# Patient Record
Sex: Female | Born: 1990 | Race: White | Hispanic: No | Marital: Married | Smoking: Never smoker
Health system: Southern US, Community
[De-identification: ages and names within clinical notes are randomized; demographics above are authoritative.]

## PROBLEM LIST (undated history)

## (undated) DIAGNOSIS — R112 Nausea with vomiting, unspecified: Secondary | ICD-10-CM

## (undated) DIAGNOSIS — Z9889 Other specified postprocedural states: Secondary | ICD-10-CM

## (undated) DIAGNOSIS — N200 Calculus of kidney: Secondary | ICD-10-CM

## (undated) DIAGNOSIS — G43909 Migraine, unspecified, not intractable, without status migrainosus: Secondary | ICD-10-CM

## (undated) DIAGNOSIS — K859 Acute pancreatitis without necrosis or infection, unspecified: Secondary | ICD-10-CM

## (undated) DIAGNOSIS — T7840XA Allergy, unspecified, initial encounter: Secondary | ICD-10-CM

## (undated) DIAGNOSIS — B019 Varicella without complication: Secondary | ICD-10-CM

## (undated) HISTORY — PX: ERCP: SHX60

## (undated) HISTORY — DX: Allergy, unspecified, initial encounter: T78.40XA

## (undated) HISTORY — PX: CHOLECYSTECTOMY: SHX55

## (undated) HISTORY — DX: Varicella without complication: B01.9

---

## 2014-04-06 ENCOUNTER — Emergency Department (HOSPITAL_BASED_OUTPATIENT_CLINIC_OR_DEPARTMENT_OTHER): Payer: BC Managed Care – PPO

## 2014-04-06 ENCOUNTER — Emergency Department (HOSPITAL_BASED_OUTPATIENT_CLINIC_OR_DEPARTMENT_OTHER)
Admission: EM | Admit: 2014-04-06 | Discharge: 2014-04-06 | Disposition: A | Discharge status: Home / Self Care | Payer: BC Managed Care – PPO | Attending: Emergency Medicine | Admitting: Emergency Medicine

## 2014-04-06 ENCOUNTER — Encounter (HOSPITAL_BASED_OUTPATIENT_CLINIC_OR_DEPARTMENT_OTHER): Payer: Self-pay | Admitting: General Practice

## 2014-04-06 DIAGNOSIS — Z3202 Encounter for pregnancy test, result negative: Secondary | ICD-10-CM | POA: Insufficient documentation

## 2014-04-06 DIAGNOSIS — Z8719 Personal history of other diseases of the digestive system: Secondary | ICD-10-CM | POA: Diagnosis not present

## 2014-04-06 DIAGNOSIS — R1031 Right lower quadrant pain: Secondary | ICD-10-CM | POA: Insufficient documentation

## 2014-04-06 DIAGNOSIS — N200 Calculus of kidney: Secondary | ICD-10-CM | POA: Insufficient documentation

## 2014-04-06 DIAGNOSIS — Z9089 Acquired absence of other organs: Secondary | ICD-10-CM | POA: Insufficient documentation

## 2014-04-06 HISTORY — DX: Acute pancreatitis without necrosis or infection, unspecified: K85.90

## 2014-04-06 LAB — URINALYSIS, ROUTINE W REFLEX MICROSCOPIC
Bilirubin Urine: NEGATIVE
Glucose, UA: NEGATIVE mg/dL
Ketones, ur: NEGATIVE mg/dL
LEUKOCYTES UA: NEGATIVE
Nitrite: NEGATIVE
PH: 6 (ref 5.0–8.0)
Protein, ur: NEGATIVE mg/dL
Specific Gravity, Urine: 1.028 (ref 1.005–1.030)
Urobilinogen, UA: 0.2 mg/dL (ref 0.0–1.0)

## 2014-04-06 LAB — COMPREHENSIVE METABOLIC PANEL
ALBUMIN: 3.9 g/dL (ref 3.5–5.2)
ALK PHOS: 105 U/L (ref 39–117)
ALT: 15 U/L (ref 0–35)
AST: 17 U/L (ref 0–37)
Anion gap: 13 (ref 5–15)
BILIRUBIN TOTAL: 0.6 mg/dL (ref 0.3–1.2)
BUN: 12 mg/dL (ref 6–23)
CHLORIDE: 105 meq/L (ref 96–112)
CO2: 24 meq/L (ref 19–32)
Calcium: 9.8 mg/dL (ref 8.4–10.5)
Creatinine, Ser: 0.9 mg/dL (ref 0.50–1.10)
GFR calc Af Amer: >90 mL/min (ref >90)
GFR calc non Af Amer: 89 mL/min — ABNORMAL LOW (ref >90)
Glucose, Bld: 110 mg/dL — ABNORMAL HIGH (ref 70–99)
Potassium: 3.9 mEq/L (ref 3.7–5.3)
Sodium: 142 mEq/L (ref 137–147)
Total Protein: 7.4 g/dL (ref 6.0–8.3)

## 2014-04-06 LAB — CBC WITH DIFFERENTIAL/PLATELET
BASOS ABS: 0 10*3/uL (ref 0.0–0.1)
BASOS PCT: 0 % (ref 0–1)
EOS PCT: 3 % (ref 0–5)
Eosinophils Absolute: 0.2 10*3/uL (ref 0.0–0.7)
HCT: 38.9 % (ref 36.0–46.0)
Hemoglobin: 12.8 g/dL (ref 12.0–15.0)
Lymphocytes Relative: 32 % (ref 12–46)
Lymphs Abs: 2.3 10*3/uL (ref 0.7–4.0)
MCH: 28.8 pg (ref 26.0–34.0)
MCHC: 32.9 g/dL (ref 30.0–36.0)
MCV: 87.4 fL (ref 78.0–100.0)
MONO ABS: 0.4 10*3/uL (ref 0.1–1.0)
Monocytes Relative: 5 % (ref 3–12)
Neutro Abs: 4.2 10*3/uL (ref 1.7–7.7)
Neutrophils Relative %: 60 % (ref 43–77)
Platelets: 320 10*3/uL (ref 150–400)
RBC: 4.45 MIL/uL (ref 3.87–5.11)
RDW: 12.6 % (ref 11.5–15.5)
WBC: 7.1 10*3/uL (ref 4.0–10.5)

## 2014-04-06 LAB — URINE MICROSCOPIC-ADD ON

## 2014-04-06 LAB — PREGNANCY, URINE: PREG TEST UR: NEGATIVE

## 2014-04-06 MED ORDER — ONDANSETRON HCL 4 MG/2ML IJ SOLN
4.0000 mg | Freq: Once | INTRAMUSCULAR | Status: AC
  Administered 2014-04-06: 4 mg via INTRAVENOUS
  Filled 2014-04-06: qty 2

## 2014-04-06 MED ORDER — HYDROMORPHONE HCL 1 MG/ML IJ SOLN
1.0000 mg | Freq: Once | INTRAMUSCULAR | Status: AC
  Administered 2014-04-06: 1 mg via INTRAVENOUS
  Filled 2014-04-06: qty 1

## 2014-04-06 MED ORDER — KETOROLAC TROMETHAMINE 30 MG/ML IJ SOLN
15.0000 mg | Freq: Once | INTRAMUSCULAR | Status: AC
  Administered 2014-04-06: 15 mg via INTRAVENOUS

## 2014-04-06 MED ORDER — OXYCODONE HCL 5 MG PO CAPS: 5.0000 mg | ORAL_CAPSULE | ORAL | Status: DC | PRN

## 2014-04-06 MED ORDER — KETOROLAC TROMETHAMINE 30 MG/ML IJ SOLN
INTRAMUSCULAR | Status: AC
  Filled 2014-04-06: qty 1

## 2014-04-06 MED ORDER — TAMSULOSIN HCL 0.4 MG PO CAPS
0.4000 mg | ORAL_CAPSULE | Freq: Once | ORAL | Status: AC
  Administered 2014-04-06: 0.4 mg via ORAL
  Filled 2014-04-06: qty 1

## 2014-04-06 MED ORDER — ONDANSETRON HCL 4 MG PO TABS: 4.0000 mg | ORAL_TABLET | Freq: Three times a day (TID) | ORAL | Status: DC | PRN

## 2014-04-06 MED ORDER — TAMSULOSIN HCL 0.4 MG PO CAPS: 0.4000 mg | ORAL_CAPSULE | Freq: Every day | ORAL | Status: DC

## 2014-04-06 NOTE — ED Notes (Signed)
Pt has abdominal pain x 2 days. Pt states she has been urinating frequently. No vomiting. Feeling nauseated now. Pt rocking back and forth due to pain.Pt c/o of pain at right lower abdominal and states it goes around to her back. Pt took aleve around 6 am.

## 2014-04-06 NOTE — Discharge Instructions (Signed)
Your CT of the abdomen shows a small (2 mm) kidney stone on the right side. It will certainly pass on its own. Keep drinking fluids. We will provide you with some medicine to help the stone pass (flomax), and to help with pain (oxycodone) and nausea (zofran).  - You should take ibuprofen 600mg  every 6 hours for the pain and take the oxyIR only as needed.  Kidney Stones Kidney stones (urolithiasis) are deposits that form inside your kidneys. The intense pain is caused by the stone moving through the urinary tract. When the stone moves, the ureter goes into spasm around the stone. The stone is usually passed in the urine.  CAUSES   A disorder that makes certain neck glands produce too much parathyroid hormone (primary hyperparathyroidism).  A buildup of uric acid crystals, similar to gout in your joints.  Narrowing (stricture) of the ureter.  A kidney obstruction present at birth (congenital obstruction).  Previous surgery on the kidney or ureters.  Numerous kidney infections. SYMPTOMS   Feeling sick to your stomach (nauseous).  Throwing up (vomiting).  Blood in the urine (hematuria).  Pain that usually spreads (radiates) to the groin.  Frequency or urgency of urination. DIAGNOSIS   Taking a history and physical exam.  Blood or urine tests.  CT scan.  Occasionally, an examination of the inside of the urinary bladder (cystoscopy) is performed. TREATMENT   Observation.  Increasing your fluid intake.  Extracorporeal shock wave lithotripsy--This is a noninvasive procedure that uses shock waves to break up kidney stones.  Surgery may be needed if you have severe pain or persistent obstruction. There are various surgical procedures. Most of the procedures are performed with the use of small instruments. Only small incisions are needed to accommodate these instruments, so recovery time is minimized. The size, location, and chemical composition are all important variables that  will determine the proper choice of action for you. Talk to your health care provider to better understand your situation so that you will minimize the risk of injury to yourself and your kidney.  HOME CARE INSTRUCTIONS   Drink enough water and fluids to keep your urine clear or pale yellow. This will help you to pass the stone or stone fragments.  Strain all urine through the provided strainer. Keep all particulate matter and stones for your health care provider to see. The stone causing the pain may be as small as a grain of salt. It is very important to use the strainer each and every time you pass your urine. The collection of your stone will allow your health care provider to analyze it and verify that a stone has actually passed. The stone analysis will often identify what you can do to reduce the incidence of recurrences.  Only take over-the-counter or prescription medicines for pain, discomfort, or fever as directed by your health care provider.  Make a follow-up appointment with your health care provider as directed.  Get follow-up X-rays if required. The absence of pain does not always mean that the stone has passed. It may have only stopped moving. If the urine remains completely obstructed, it can cause loss of kidney function or even complete destruction of the kidney. It is your responsibility to make sure X-rays and follow-ups are completed. Ultrasounds of the kidney can show blockages and the status of the kidney. Ultrasounds are not associated with any radiation and can be performed easily in a matter of minutes. SEEK MEDICAL CARE IF:  You experience pain that  is progressive and unresponsive to any pain medicine you have been prescribed. SEEK IMMEDIATE MEDICAL CARE IF:   Pain cannot be controlled with the prescribed medicine.  You have a fever or shaking chills.  The severity or intensity of pain increases over 18 hours and is not relieved by pain medicine.  You develop a new  onset of abdominal pain.  You feel faint or pass out.  You are unable to urinate. MAKE SURE YOU:   Understand these instructions.  Will watch your condition.  Will get help right away if you are not doing well or get worse. Document Released: 04/27/2005 Document Revised: 12/28/2012 Document Reviewed: 09/28/2012 [facility] Patient Information 2015 [employer] [place], [place]. This information is not intended to replace advice given to you by your health care provider. Make sure you discuss any questions you have with your health care provider.

## 2014-04-06 NOTE — ED Provider Notes (Signed)
CSN: [id number]     Arrival date & time 04/06/14  0702 History   First MD Initiated Contact with Patient 04/06/14 [phone]     Chief Complaint  Patient presents with  . Abdominal Pain   23 yo female presenting with abdominal pain.   Pain started 2 days ago, was intermittent and mild-moderate and has become constant and severe located mostly in the RLQ radiating to the right flank. The location has not changed. She has tried aleve with minimal relief of pain. She has had nausea without emesis. Appetite has been diminished but not gone. Ate cereal this morning. She has had many stools after drinking a large quantity of cranberry juice because she thought she may have a UTI. She denies fevers, chills, dysuria, urinary frequency or urgency, hematuria, diarrhea or constipation or blood in her stool. She is sexually active with 1 female partner, uses OCP daily without missing doses, no history of GC/Chl in her or him. She is currently menstruating, started 11/25. No vaginal discharge. She has no history of diabetes but checks her blood sugar intermittently, reports it has been < 200.   She has a history of gallstone pancreatitis s/p cholecystectomy.   (Consider location/radiation/quality/duration/timing/severity/associated sxs/prior Treatment) HPI  Past Medical History  Diagnosis Date  . Pancreatitis    Past Surgical History  Procedure Laterality Date  . Cholecystectomy     History reviewed. No pertinent family history. History  Substance Use Topics  . Smoking status: Not on file  . Smokeless tobacco: Not on file  . Alcohol Use: Not on file   OB History    No data available     Review of Systems  Constitutional: Positive for appetite change. Negative for fever, chills and diaphoresis.  HENT: Negative for rhinorrhea and sneezing.   Eyes: Negative for photophobia.  Respiratory: Negative for cough, chest tightness, shortness of breath and wheezing.   Cardiovascular: Negative for chest pain.   Gastrointestinal: Positive for nausea and abdominal pain. Negative for vomiting, diarrhea, constipation, blood in stool and abdominal distention.  Endocrine: Negative for polydipsia, polyphagia and polyuria.  Genitourinary: Negative for dysuria, urgency, frequency, hematuria, flank pain, decreased urine volume and difficulty urinating.  Musculoskeletal: Positive for back pain (R flank). Negative for myalgias, neck pain and neck stiffness.  Skin: Negative for rash.  Allergic/Immunologic: Negative for food allergies.  Neurological: Negative for syncope.      Allergies  Review of patient's allergies indicates no known allergies.  Home Medications   Prior to Admission medications   Not on File   BP 132/99 mmHg  Pulse 93  Temp(Src) 98.3 F (36.8 C) (Oral)  Resp 20  Ht 5\' 7"  (1.702 m)  Wt 177 lb (80.287 kg)  BMI 27.72 kg/m2  SpO2 100%  LMP 04/04/2014 (Exact Date) Physical Exam  Constitutional: She is oriented to person, place, and time. She appears well-developed and well-nourished.  23 yo F retching without emesis as I walk into room. Sitting on side of the bed rocking back and forth.  HENT:  Mouth/Throat: Oropharynx is clear and moist.  Neck: Normal range of motion.  Cardiovascular: Normal rate, regular rhythm and normal heart sounds.   cap refill < 3 sec  Pulmonary/Chest: Effort normal and breath sounds normal.  Abdominal: Soft. She exhibits no distension and no mass. There is tenderness (to palpation of RLQ and deep palpation of right lower flank (well below CVA)). There is no rebound and no guarding.  Musculoskeletal: She exhibits no edema.  Neurological: She  is alert and oriented to person, place, and time. She exhibits normal muscle tone.  Skin: Skin is warm and dry. No rash noted.  Vitals reviewed.   ED Course  Procedures (including critical care time) Labs Review Labs Reviewed  URINALYSIS, ROUTINE W REFLEX MICROSCOPIC - Abnormal; Notable for the following:     Hgb urine dipstick SMALL (*)    All other components within normal limits  COMPREHENSIVE METABOLIC PANEL - Abnormal; Notable for the following:    Glucose, Bld 110 (*)    GFR calc non Af Amer 89 (*)    All other components within normal limits  URINE MICROSCOPIC-ADD ON - Abnormal; Notable for the following:    Squamous Epithelial / LPF FEW (*)    Bacteria, UA FEW (*)    All other components within normal limits  PREGNANCY, URINE  CBC WITH DIFFERENTIAL    Imaging Review Ct Abdomen Pelvis Wo Contrast  04/06/2014   CLINICAL DATA:  RIGHT lower quadrant pain, nausea, vomiting, personal history of pancreatitis and cholecystectomy  EXAM: CT ABDOMEN AND PELVIS WITHOUT CONTRAST  TECHNIQUE: Multidetector CT imaging of the abdomen and pelvis was performed following the standard protocol without IV contrast. Sagittal and coronal MPR images reconstructed from axial data set.  COMPARISON:  None  FINDINGS: Lung bases clear.  RIGHT hydronephrosis and hydroureter secondary to a 2 mm calculus at the RIGHT ureterovesical junction.  No additional urinary tract calcifications.  Upper normal hepatic attenuation.  Within limits of technique, liver, spleen, pancreas, kidneys, and adrenal glands otherwise normal.  Stomach and bowel loops grossly unremarkable for exam lacking IV and oral contrast.  Normal appendix, uterus, adnexae, and LEFT ureter.  No mass, adenopathy, free air or hernia.  Tiny amount of nonspecific low-attenuation fluid in pelvis, potentially physiologic.  Tampon in vagina.  Osseous structures unremarkable.  IMPRESSION: LEFT hydronephrosis and hydroureter secondary to a 2 mm RIGHT UVJ calculus.   Electronically Signed   By: Ulyses SouthwardMark  Boles M.D.   On: 04/06/2014 08:10     EKG Interpretation None      MDM   Final diagnoses:  Right lower quadrant abdominal pain  Renal calculus, right   23 yo with abdominal pain focused in RLQ/right flank. Need CT to r/o appendicitis and evaluate for  nephrolithiasis.  CT Abd shows 2mm calculus at right UV junction with RIGHT hydronephrosis and hydroureter. Will provide analgesia, antiemetic and alpha blocker given it is a distal subcentimeter stone. Will almost certainly pass spontaneously. She is stable for discharge.   Helio Lack B. Jarvis NewcomerGrunz, MD, PGY-2 04/06/2014 8:37 AM   Tyrone Nineyan B Elodie Panameno, MD 04/06/14 [id number]  Nelia Shiobert L Beaton, MD 04/06/14 1524

## 2014-08-23 LAB — OB RESULTS CONSOLE HIV ANTIBODY (ROUTINE TESTING): HIV: NONREACTIVE

## 2014-08-23 LAB — OB RESULTS CONSOLE HEPATITIS B SURFACE ANTIGEN: Hepatitis B Surface Ag: NEGATIVE

## 2014-08-23 LAB — OB RESULTS CONSOLE RUBELLA ANTIBODY, IGM: Rubella: IMMUNE

## 2014-08-23 LAB — OB RESULTS CONSOLE RPR: RPR: NONREACTIVE

## 2014-09-04 LAB — OB RESULTS CONSOLE GC/CHLAMYDIA
Chlamydia: NEGATIVE
GC PROBE AMP, GENITAL: NEGATIVE

## 2014-12-10 ENCOUNTER — Inpatient Hospital Stay (HOSPITAL_COMMUNITY): Admission: AD | Admit: 2014-12-10 | Payer: Self-pay | Source: Ambulatory Visit | Admitting: Obstetrics and Gynecology

## 2015-03-06 LAB — OB RESULTS CONSOLE GBS: GBS: NEGATIVE

## 2015-04-06 ENCOUNTER — Inpatient Hospital Stay (HOSPITAL_COMMUNITY)
Admission: AD | Admit: 2015-04-06 | Discharge: 2015-04-06 | Disposition: A | Discharge status: Home / Self Care | Payer: BLUE CROSS/BLUE SHIELD | Source: Ambulatory Visit | Attending: Obstetrics and Gynecology | Admitting: Obstetrics and Gynecology

## 2015-04-06 ENCOUNTER — Encounter (HOSPITAL_COMMUNITY): Payer: Self-pay

## 2015-04-06 HISTORY — DX: Calculus of kidney: N20.0

## 2015-04-06 HISTORY — DX: Nausea with vomiting, unspecified: R11.2

## 2015-04-06 HISTORY — DX: Other specified postprocedural states: Z98.890

## 2015-04-06 NOTE — Discharge Instructions (Signed)
Braxton Hicks Contractions °Contractions of the uterus can occur throughout pregnancy. Contractions are not always a sign that you are in labor.  °WHAT ARE BRAXTON HICKS CONTRACTIONS?  °Contractions that occur before labor are called Braxton Hicks contractions, or false labor. Toward the end of pregnancy (32-34 weeks), these contractions can develop more often and may become more forceful. This is not true labor because these contractions do not result in opening (dilatation) and thinning of the cervix. They are sometimes difficult to tell apart from true labor because these contractions can be forceful and people have different pain tolerances. You should not feel embarrassed if you go to the hospital with false labor. Sometimes, the only way to tell if you are in true labor is for your health care provider to look for changes in the cervix. °If there are no prenatal problems or other health problems associated with the pregnancy, it is completely safe to be sent home with false labor and await the onset of true labor. °HOW CAN YOU TELL THE DIFFERENCE BETWEEN TRUE AND FALSE LABOR? °False Labor °· The contractions of false labor are usually shorter and not as hard as those of true labor.   °· The contractions are usually irregular.   °· The contractions are often felt in the front of the lower abdomen and in the groin.   °· The contractions may go away when you walk around or change positions while lying down.   °· The contractions get weaker and are shorter lasting as time goes on.   °· The contractions do not usually become progressively stronger, regular, and closer together as with true labor.   °True Labor °1. Contractions in true labor last 30-70 seconds, become very regular, usually become more intense, and increase in frequency.   °2. The contractions do not go away with walking.   °3. The discomfort is usually felt in the top of the uterus and spreads to the lower abdomen and low back.   °4. True labor can  be determined by your health care provider with an exam. This will show that the cervix is dilating and getting thinner.   °WHAT TO REMEMBER °· Keep up with your usual exercises and follow other instructions given by your health care provider.   °· Take medicines as directed by your health care provider.   °· Keep your regular prenatal appointments.   °· Eat and drink lightly if you think you are going into labor.   °· If Braxton Hicks contractions are making you uncomfortable:   °· Change your position from lying down or resting to walking, or from walking to resting.   °· Sit and rest in a tub of warm water.   °· Drink 2-3 glasses of water. Dehydration may cause these contractions.   °· Do slow and deep breathing several times an hour.   °WHEN SHOULD I SEEK IMMEDIATE MEDICAL CARE? °Seek immediate medical care if: °· Your contractions become stronger, more regular, and closer together.   °· You have fluid leaking or gushing from your vagina.   °· You have a fever.   °· You pass blood-tinged mucus.   °· You have vaginal bleeding.   °· You have continuous abdominal pain.   °· You have low back pain that you never had before.   °· You feel your baby's head pushing down and causing pelvic pressure.   °· Your baby is not moving as much as it used to.   °  °This information is not intended to replace advice given to you by your health care provider. Make sure you discuss any questions you have with your health care   provider. °  °Document Released: 04/27/2005 Document Revised: 05/02/2013 Document Reviewed: 02/06/2013 °Elsevier Interactive Patient Education ©2016 [employer] ° °Fetal Movement Counts °Patient Name: __________________________________________________ Patient Due Date: ____________________ °Performing a fetal movement count is highly recommended in high-risk pregnancies, but it is good for every pregnant woman to do. Your health care provider may ask you to start counting fetal movements at 28 weeks of the  pregnancy. Fetal movements often increase: °· After eating a full meal. °· After physical activity. °· After eating or drinking something sweet or cold. °· At rest. °Pay attention to when you feel the baby is most active. This will help you notice a pattern of your baby's sleep and wake cycles and what factors contribute to an increase in fetal movement. It is important to perform a fetal movement count at the same time each day when your baby is normally most active.  °HOW TO COUNT FETAL MOVEMENTS °5. Find a quiet and comfortable area to sit or lie down on your left side. Lying on your left side provides the best blood and oxygen circulation to your baby. °6. Write down the day and time on a sheet of paper or in a journal. °7. Start counting kicks, flutters, swishes, rolls, or jabs in a 2-hour period. You should feel at least 10 movements within 2 hours. °8. If you do not feel 10 movements in 2 hours, wait 2-3 hours and count again. Look for a change in the pattern or not enough counts in 2 hours. °SEEK MEDICAL CARE IF: °· You feel less than 10 counts in 2 hours, tried twice. °· There is no movement in over an hour. °· The pattern is changing or taking longer each day to reach 10 counts in 2 hours. °· You feel the baby is not moving as he or she usually does. °Date: ____________ Movements: ____________ Start time: ____________ Finish time: ____________  °Date: ____________ Movements: ____________ Start time: ____________ Finish time: ____________ °Date: ____________ Movements: ____________ Start time: ____________ Finish time: ____________ °Date: ____________ Movements: ____________ Start time: ____________ Finish time: ____________ °Date: ____________ Movements: ____________ Start time: ____________ Finish time: ____________ °Date: ____________ Movements: ____________ Start time: ____________ Finish time: ____________ °Date: ____________ Movements: ____________ Start time: ____________ Finish time:  ____________ °Date: ____________ Movements: ____________ Start time: ____________ Finish time: ____________  °Date: ____________ Movements: ____________ Start time: ____________ Finish time: ____________ °Date: ____________ Movements: ____________ Start time: ____________ Finish time: ____________ °Date: ____________ Movements: ____________ Start time: ____________ Finish time: ____________ °Date: ____________ Movements: ____________ Start time: ____________ Finish time: ____________ °Date: ____________ Movements: ____________ Start time: ____________ Finish time: ____________ °Date: ____________ Movements: ____________ Start time: ____________ Finish time: ____________ °Date: ____________ Movements: ____________ Start time: ____________ Finish time: ____________  °Date: ____________ Movements: ____________ Start time: ____________ Finish time: ____________ °Date: ____________ Movements: ____________ Start time: ____________ Finish time: ____________ °Date: ____________ Movements: ____________ Start time: ____________ Finish time: ____________ °Date: ____________ Movements: ____________ Start time: ____________ Finish time: ____________ °Date: ____________ Movements: ____________ Start time: ____________ Finish time: ____________ °Date: ____________ Movements: ____________ Start time: ____________ Finish time: ____________ °Date: ____________ Movements: ____________ Start time: ____________ Finish time: ____________  °Date: ____________ Movements: ____________ Start time: ____________ Finish time: ____________ °Date: ____________ Movements: ____________ Start time: ____________ Finish time: ____________ °Date: ____________ Movements: ____________ Start time: ____________ Finish time: ____________ °Date: ____________ Movements: ____________ Start time: ____________ Finish time: ____________ °Date: ____________ Movements: ____________ Start time: ____________ Finish time: ____________ °Date: ____________ Movements:  ____________ Start time: ____________ Finish   time: ____________ °Date: ____________ Movements: ____________ Start time: ____________ Finish time: ____________  °Date: ____________ Movements: ____________ Start time: ____________ Finish time: ____________ °Date: ____________ Movements: ____________ Start time: ____________ Finish time: ____________ °Date: ____________ Movements: ____________ Start time: ____________ Finish time: ____________ °Date: ____________ Movements: ____________ Start time: ____________ Finish time: ____________ °Date: ____________ Movements: ____________ Start time: ____________ Finish time: ____________ °Date: ____________ Movements: ____________ Start time: ____________ Finish time: ____________ °Date: ____________ Movements: ____________ Start time: ____________ Finish time: ____________  °Date: ____________ Movements: ____________ Start time: ____________ Finish time: ____________ °Date: ____________ Movements: ____________ Start time: ____________ Finish time: ____________ °Date: ____________ Movements: ____________ Start time: ____________ Finish time: ____________ °Date: ____________ Movements: ____________ Start time: ____________ Finish time: ____________ °Date: ____________ Movements: ____________ Start time: ____________ Finish time: ____________ °Date: ____________ Movements: ____________ Start time: ____________ Finish time: ____________ °Date: ____________ Movements: ____________ Start time: ____________ Finish time: ____________  °Date: ____________ Movements: ____________ Start time: ____________ Finish time: ____________ °Date: ____________ Movements: ____________ Start time: ____________ Finish time: ____________ °Date: ____________ Movements: ____________ Start time: ____________ Finish time: ____________ °Date: ____________ Movements: ____________ Start time: ____________ Finish time: ____________ °Date: ____________ Movements: ____________ Start time: ____________ Finish  time: ____________ °Date: ____________ Movements: ____________ Start time: ____________ Finish time: ____________ °Date: ____________ Movements: ____________ Start time: ____________ Finish time: ____________  °Date: ____________ Movements: ____________ Start time: ____________ Finish time: ____________ °Date: ____________ Movements: ____________ Start time: ____________ Finish time: ____________ °Date: ____________ Movements: ____________ Start time: ____________ Finish time: ____________ °Date: ____________ Movements: ____________ Start time: ____________ Finish time: ____________ °Date: ____________ Movements: ____________ Start time: ____________ Finish time: ____________ °Date: ____________ Movements: ____________ Start time: ____________ Finish time: ____________ °  °This information is not intended to replace advice given to you by your health care provider. Make sure you discuss any questions you have with your health care provider. °  °Document Released: 05/27/2006 Document Revised: 05/18/2014 Document Reviewed: 02/22/2012 °Elsevier Interactive Patient Education ©2016 [employer] ° °

## 2015-04-06 NOTE — MAU Note (Signed)
Pt c/o contractions every 7 mins for 1 hour. Denies LOF. Had some brownish discharge earlier today but none now. +FM. 2cm on Wednesday.

## 2015-04-07 ENCOUNTER — Encounter (HOSPITAL_COMMUNITY): Payer: Self-pay

## 2015-04-07 ENCOUNTER — Inpatient Hospital Stay (HOSPITAL_COMMUNITY)
Admission: AD | Admit: 2015-04-07 | Discharge: 2015-04-10 | DRG: 775 | Disposition: A | Discharge status: Home / Self Care | Payer: BLUE CROSS/BLUE SHIELD | Source: Ambulatory Visit | Attending: Obstetrics and Gynecology | Admitting: Obstetrics and Gynecology

## 2015-04-07 DIAGNOSIS — R51 Headache: Secondary | ICD-10-CM | POA: Diagnosis present

## 2015-04-07 DIAGNOSIS — Z3A41 41 weeks gestation of pregnancy: Secondary | ICD-10-CM

## 2015-04-07 DIAGNOSIS — Z6832 Body mass index (BMI) 32.0-32.9, adult: Secondary | ICD-10-CM

## 2015-04-07 DIAGNOSIS — E669 Obesity, unspecified: Secondary | ICD-10-CM | POA: Diagnosis present

## 2015-04-07 DIAGNOSIS — O480 Post-term pregnancy: Principal | ICD-10-CM | POA: Diagnosis present

## 2015-04-07 DIAGNOSIS — O99214 Obesity complicating childbirth: Secondary | ICD-10-CM | POA: Diagnosis present

## 2015-04-07 HISTORY — DX: Migraine, unspecified, not intractable, without status migrainosus: G43.909

## 2015-04-07 NOTE — MAU Note (Signed)
Pt presents for labor eval 

## 2015-04-08 ENCOUNTER — Inpatient Hospital Stay (HOSPITAL_COMMUNITY): Payer: BLUE CROSS/BLUE SHIELD | Admitting: Anesthesiology

## 2015-04-08 ENCOUNTER — Encounter (HOSPITAL_COMMUNITY): Payer: Self-pay | Admitting: Emergency Medicine

## 2015-04-08 DIAGNOSIS — O99214 Obesity complicating childbirth: Secondary | ICD-10-CM | POA: Diagnosis present

## 2015-04-08 DIAGNOSIS — R51 Headache: Secondary | ICD-10-CM | POA: Diagnosis present

## 2015-04-08 DIAGNOSIS — O480 Post-term pregnancy: Secondary | ICD-10-CM | POA: Diagnosis present

## 2015-04-08 DIAGNOSIS — E669 Obesity, unspecified: Secondary | ICD-10-CM | POA: Diagnosis present

## 2015-04-08 DIAGNOSIS — Z6832 Body mass index (BMI) 32.0-32.9, adult: Secondary | ICD-10-CM | POA: Diagnosis not present

## 2015-04-08 DIAGNOSIS — Z3A41 41 weeks gestation of pregnancy: Secondary | ICD-10-CM | POA: Diagnosis not present

## 2015-04-08 LAB — CBC
HCT: 32.7 % — ABNORMAL LOW (ref 36.0–46.0)
HEMOGLOBIN: 10.6 g/dL — AB (ref 12.0–15.0)
MCH: 26.6 pg (ref 26.0–34.0)
MCHC: 32.4 g/dL (ref 30.0–36.0)
MCV: 82.2 fL (ref 78.0–100.0)
Platelets: 259 10*3/uL (ref 150–400)
RBC: 3.98 MIL/uL (ref 3.87–5.11)
RDW: 14.7 % (ref 11.5–15.5)
WBC: 11.2 10*3/uL — ABNORMAL HIGH (ref 4.0–10.5)

## 2015-04-08 LAB — TYPE AND SCREEN
ABO/RH(D): O POS
Antibody Screen: NEGATIVE

## 2015-04-08 LAB — RPR: RPR: NONREACTIVE

## 2015-04-08 LAB — ABO/RH: ABO/RH(D): O POS

## 2015-04-08 MED ORDER — SIMETHICONE 80 MG PO CHEW: 80.0000 mg | CHEWABLE_TABLET | ORAL | Status: DC | PRN

## 2015-04-08 MED ORDER — LIDOCAINE HCL (PF) 1 % IJ SOLN
INTRAMUSCULAR | Status: DC | PRN
  Administered 2015-04-08 (×2): 4 mL

## 2015-04-08 MED ORDER — DIBUCAINE 1 % RE OINT: 1.0000 "application " | TOPICAL_OINTMENT | RECTAL | Status: DC | PRN

## 2015-04-08 MED ORDER — MEDROXYPROGESTERONE ACETATE 150 MG/ML IM SUSP: 150.0000 mg | INTRAMUSCULAR | Status: DC | PRN

## 2015-04-08 MED ORDER — DIPHENHYDRAMINE HCL 25 MG PO CAPS: 25.0000 mg | ORAL_CAPSULE | Freq: Four times a day (QID) | ORAL | Status: DC | PRN

## 2015-04-08 MED ORDER — IBUPROFEN 600 MG PO TABS
600.0000 mg | ORAL_TABLET | Freq: Four times a day (QID) | ORAL | Status: DC
  Administered 2015-04-08 – 2015-04-10 (×8): 600 mg via ORAL
  Filled 2015-04-08 (×8): qty 1

## 2015-04-08 MED ORDER — FENTANYL 2.5 MCG/ML BUPIVACAINE 1/10 % EPIDURAL INFUSION (WH - ANES)
INTRAMUSCULAR | Status: AC
  Administered 2015-04-08: 14 mL/h via EPIDURAL
  Filled 2015-04-08: qty 125

## 2015-04-08 MED ORDER — LACTATED RINGERS IV SOLN: INTRAVENOUS | Status: DC

## 2015-04-08 MED ORDER — WITCH HAZEL-GLYCERIN EX PADS: 1.0000 "application " | MEDICATED_PAD | CUTANEOUS | Status: DC | PRN

## 2015-04-08 MED ORDER — LACTATED RINGERS IV SOLN
INTRAVENOUS | Status: DC
  Administered 2015-04-08: via INTRAVENOUS

## 2015-04-08 MED ORDER — EPHEDRINE 5 MG/ML INJ
10.0000 mg | INTRAVENOUS | Status: DC | PRN
  Filled 2015-04-08: qty 2

## 2015-04-08 MED ORDER — OXYCODONE-ACETAMINOPHEN 5-325 MG PO TABS: 1.0000 | ORAL_TABLET | ORAL | Status: DC | PRN

## 2015-04-08 MED ORDER — ONDANSETRON HCL 4 MG/2ML IJ SOLN: 4.0000 mg | Freq: Four times a day (QID) | INTRAMUSCULAR | Status: DC | PRN

## 2015-04-08 MED ORDER — CITRIC ACID-SODIUM CITRATE 334-500 MG/5ML PO SOLN: 30.0000 mL | ORAL | Status: DC | PRN

## 2015-04-08 MED ORDER — ACETAMINOPHEN 325 MG PO TABS: 650.0000 mg | ORAL_TABLET | ORAL | Status: DC | PRN

## 2015-04-08 MED ORDER — LACTATED RINGERS IV SOLN: 500.0000 mL | INTRAVENOUS | Status: DC | PRN

## 2015-04-08 MED ORDER — OXYTOCIN BOLUS FROM INFUSION: 500.0000 mL | INTRAVENOUS | Status: DC

## 2015-04-08 MED ORDER — OXYTOCIN 40 UNITS IN LACTATED RINGERS INFUSION - SIMPLE MED
62.5000 mL/h | INTRAVENOUS | Status: DC
  Filled 2015-04-08: qty 1000

## 2015-04-08 MED ORDER — ONDANSETRON HCL 4 MG/2ML IJ SOLN: 4.0000 mg | INTRAMUSCULAR | Status: DC | PRN

## 2015-04-08 MED ORDER — BENZOCAINE-MENTHOL 20-0.5 % EX AERO
1.0000 "application " | INHALATION_SPRAY | CUTANEOUS | Status: DC | PRN
  Administered 2015-04-08: 1 via TOPICAL
  Filled 2015-04-08: qty 56

## 2015-04-08 MED ORDER — BUTORPHANOL TARTRATE 1 MG/ML IJ SOLN: 1.0000 mg | INTRAMUSCULAR | Status: DC | PRN

## 2015-04-08 MED ORDER — DIPHENHYDRAMINE HCL 50 MG/ML IJ SOLN: 12.5000 mg | INTRAMUSCULAR | Status: DC | PRN

## 2015-04-08 MED ORDER — MEASLES, MUMPS & RUBELLA VAC ~~LOC~~ INJ
0.5000 mL | INJECTION | Freq: Once | SUBCUTANEOUS | Status: DC
  Filled 2015-04-08: qty 0.5

## 2015-04-08 MED ORDER — PHENYLEPHRINE 40 MCG/ML (10ML) SYRINGE FOR IV PUSH (FOR BLOOD PRESSURE SUPPORT)
PREFILLED_SYRINGE | INTRAVENOUS | Status: AC
  Filled 2015-04-08: qty 20

## 2015-04-08 MED ORDER — OXYCODONE-ACETAMINOPHEN 5-325 MG PO TABS: 2.0000 | ORAL_TABLET | ORAL | Status: DC | PRN

## 2015-04-08 MED ORDER — ONDANSETRON HCL 4 MG PO TABS: 4.0000 mg | ORAL_TABLET | ORAL | Status: DC | PRN

## 2015-04-08 MED ORDER — LIDOCAINE HCL (PF) 1 % IJ SOLN
30.0000 mL | INTRAMUSCULAR | Status: DC | PRN
  Filled 2015-04-08: qty 30

## 2015-04-08 MED ORDER — FENTANYL 2.5 MCG/ML BUPIVACAINE 1/10 % EPIDURAL INFUSION (WH - ANES)
14.0000 mL/h | INTRAMUSCULAR | Status: DC | PRN
  Administered 2015-04-08 (×2): 14 mL/h via EPIDURAL

## 2015-04-08 MED ORDER — PHENYLEPHRINE 40 MCG/ML (10ML) SYRINGE FOR IV PUSH (FOR BLOOD PRESSURE SUPPORT)
80.0000 ug | PREFILLED_SYRINGE | INTRAVENOUS | Status: DC | PRN
  Filled 2015-04-08: qty 2

## 2015-04-08 MED ORDER — TETANUS-DIPHTH-ACELL PERTUSSIS 5-2.5-18.5 LF-MCG/0.5 IM SUSP: 0.5000 mL | Freq: Once | INTRAMUSCULAR | Status: DC

## 2015-04-08 MED ORDER — SENNOSIDES-DOCUSATE SODIUM 8.6-50 MG PO TABS
2.0000 | ORAL_TABLET | ORAL | Status: DC
  Administered 2015-04-09 – 2015-04-10 (×2): 2 via ORAL
  Filled 2015-04-08 (×2): qty 2

## 2015-04-08 MED ORDER — LANOLIN HYDROUS EX OINT: TOPICAL_OINTMENT | CUTANEOUS | Status: DC | PRN

## 2015-04-08 MED ORDER — PRENATAL MULTIVITAMIN CH
1.0000 | ORAL_TABLET | Freq: Every day | ORAL | Status: DC
  Filled 2015-04-08 (×2): qty 1

## 2015-04-08 NOTE — Anesthesia Procedure Notes (Signed)
Epidural Patient location during procedure: OB  Staffing Anesthesiologist: Carliss Porcaro Performed by: anesthesiologist   Preanesthetic Checklist Completed: patient identified, site marked, surgical consent, pre-op evaluation, timeout performed, IV checked, risks and benefits discussed and monitors and equipment checked  Epidural Patient position: sitting Prep: site prepped and draped and DuraPrep Patient monitoring: continuous pulse ox and blood pressure Approach: midline Location: L3-L4 Injection technique: LOR saline  Needle:  Needle type: Tuohy  Needle gauge: 17 G Needle length: 9 cm and 9 Needle insertion depth: 5 cm cm Catheter type: closed end flexible Catheter size: 19 Gauge Catheter at skin depth: 10 cm Test dose: negative  Assessment Events: blood not aspirated, injection not painful, no injection resistance, negative IV test and no paresthesia  Additional Notes Patient identified. Risks/Benefits/Options discussed with patient including but not limited to bleeding, infection, nerve damage, paralysis, failed block, incomplete pain control, headache, blood pressure changes, nausea, vomiting, reactions to medication both or allergic, itching and postpartum back pain. Confirmed with bedside nurse the patient's most recent platelet count. Confirmed with patient that they are not currently taking any anticoagulation, have any bleeding history or any family history of bleeding disorders. Patient expressed understanding and wished to proceed. All questions were answered. Sterile technique was used throughout the entire procedure. Please see nursing notes for vital signs. Test dose was given through epidural catheter and negative prior to continuing to dose epidural or start infusion. Warning signs of high block given to the patient including shortness of breath, tingling/numbness in hands, complete motor block, or any concerning symptoms with instructions to call for help. Patient was  given instructions on fall risk and not to get out of bed. All questions and concerns addressed with instructions to call with any issues or inadequate analgesia.      

## 2015-04-08 NOTE — Anesthesia Preprocedure Evaluation (Signed)
Anesthesia Evaluation  Patient identified by MRN, date of birth, ID band Patient awake    Reviewed: Allergy & Precautions, NPO status , Patient's Chart, lab work & pertinent test results  History of Anesthesia Complications (+) PONV and history of anesthetic complications  Airway Mallampati: II  TM Distance: >3 FB Neck ROM: Full    Dental no notable dental hx. (+) Dental Advisory Given   Pulmonary neg pulmonary ROS,    Pulmonary exam normal breath sounds clear to auscultation       Cardiovascular negative cardio ROS Normal cardiovascular exam Rhythm:Regular Rate:Normal     Neuro/Psych  Headaches, negative psych ROS   GI/Hepatic negative GI ROS, Neg liver ROS,   Endo/Other  obesity  Renal/GU negative Renal ROS  negative genitourinary   Musculoskeletal negative musculoskeletal ROS (+)   Abdominal   Peds negative pediatric ROS (+)  Hematology negative hematology ROS (+)   Anesthesia Other Findings   Reproductive/Obstetrics (+) Pregnancy                             Anesthesia Physical Anesthesia Plan  ASA: II  Anesthesia Plan: Epidural   Post-op Pain Management:    Induction:   Airway Management Planned:   Additional Equipment:   Intra-op Plan:   Post-operative Plan:   Informed Consent: I have reviewed the patients History and Physical, chart, labs and discussed the procedure including the risks, benefits and alternatives for the proposed anesthesia with the patient or authorized representative who has indicated his/her understanding and acceptance.   Dental advisory given  Plan Discussed with:   Anesthesia Plan Comments:         Anesthesia Quick Evaluation

## 2015-04-08 NOTE — H&P (Signed)
Lynn RicksDanielle Mendez is a 24 y.o. female G1P0 @ 42 weeks presenting for SOL.  Pregnancy uncomplicated.   History OB History    Gravida Para Term Preterm AB TAB SAB Ectopic Multiple Living   1              Past Medical History  Diagnosis Date  . Pancreatitis   . Kidney stones   . PONV (postoperative nausea and vomiting)   . Migraines    Past Surgical History  Procedure Laterality Date  . Cholecystectomy     Family History: family history is not on file. Social History:  reports that she has never smoked. She does not have any smokeless tobacco history on file. Her alcohol and drug histories are not on file.   Prenatal Transfer Tool  Maternal Diabetes: Mendez Genetic Screening: Declined Maternal Ultrasounds/Referrals: Normal Fetal Ultrasounds or other Referrals:  None Maternal Substance Abuse:  Mendez Significant Maternal Medications:  None Significant Maternal Lab Results:  None Other Comments:  None  ROS  Dilation: 8.5 Effacement (%): 100 Station: +1 Exam by:: C.Okoroji RN-BSN Blood pressure 131/82, pulse 70, temperature 98.3 F (36.8 C), temperature source Oral, resp. rate 18, height 5\' 7"  (1.702 m), weight 209 lb (94.802 kg), last menstrual period 04/04/2014, SpO2 98 %. Exam Physical Exam  Gen - Uncomfortable w/ ctx Abd - gravid, NT Ext - NT Cvx 4cm on admission Prenatal labs: ABO, Rh: --/--/O POS, O POS (11/28 0015) Antibody: NEG (11/28 0015) Rubella: Immune (04/14 0000) RPR: Nonreactive (04/14 0000)  HBsAg: Negative (04/14 0000)  HIV: Non-reactive (04/14 0000)  GBS: Negative (10/26 0000)   Assessment/Plan: Labor Epidural prn Exp mngt   Lynn Mendez 04/08/2015, 4:49 AM

## 2015-04-08 NOTE — Progress Notes (Signed)
CTSP - prolonged FHT decels lasting 3-465min after pushing Fetus @ +2 station - rec vacuum application, pt agreed Foley & FSE, removed Bell vacuum applied and fetus delivered with next 2 contractions, one popoff Apgars 8,9 2nd degree MLE cut and repaired w/ 3-0 vicyrl rapide Placenta delivered spontaneous, intact w/ 3VC Mom and baby stable in LDR

## 2015-04-09 LAB — CBC
HCT: 29.8 % — ABNORMAL LOW (ref 36.0–46.0)
Hemoglobin: 9.7 g/dL — ABNORMAL LOW (ref 12.0–15.0)
MCH: 26.9 pg (ref 26.0–34.0)
MCHC: 32.6 g/dL (ref 30.0–36.0)
MCV: 82.8 fL (ref 78.0–100.0)
PLATELETS: 218 10*3/uL (ref 150–400)
RBC: 3.6 MIL/uL — ABNORMAL LOW (ref 3.87–5.11)
RDW: 15 % (ref 11.5–15.5)
WBC: 10.7 10*3/uL — ABNORMAL HIGH (ref 4.0–10.5)

## 2015-04-09 NOTE — Progress Notes (Signed)
Post Partum Day 1 Subjective: no complaints, up ad lib, voiding, tolerating PO and + flatus  Objective: Blood pressure 105/65, pulse 58, temperature 97.9 F (36.6 C), temperature source Oral, resp. rate 18, height 5\' 7"  (1.702 m), weight 209 lb (94.802 kg), last menstrual period 04/04/2014, SpO2 97 %, unknown if currently breastfeeding.  Physical Exam:  General: alert and cooperative Lochia: appropriate Uterine Fundus: firm Incision: healing well DVT Evaluation: No evidence of DVT seen on physical exam. Negative Homan's sign. No cords or calf tenderness. No significant calf/ankle edema.   Recent Labs  04/08/15 0015 04/09/15 0503  HGB 10.6* 9.7*  HCT 32.7* 29.8*    Assessment/Plan: Plan for discharge tomorrow   LOS: 1 day   Krayton Wortley G 04/09/2015, 8:00 AM

## 2015-04-09 NOTE — Anesthesia Postprocedure Evaluation (Signed)
Anesthesia Post Note  Patient: Lynn RicksDanielle Turnage  Procedure(s) Performed: * No procedures listed *  Patient location during evaluation: Mother Baby Anesthesia Type: Epidural Level of consciousness: awake and alert and oriented Pain management: pain level controlled Vital Signs Assessment: post-procedure vital signs reviewed and stable Respiratory status: spontaneous breathing Cardiovascular status: blood pressure returned to baseline and stable Postop Assessment: no headache, no backache, patient able to bend at knees, no signs of nausea or vomiting and adequate PO intake Anesthetic complications: no    Last Vitals:  Filed Vitals:   04/08/15 2110 04/09/15 0630  BP: 105/64 105/65  Pulse: 72 58  Temp: 36.6 C 36.6 C  Resp: 18 18    Last Pain:  Filed Vitals:   04/09/15 0734  PainSc: 1                  Zoha Spranger

## 2015-04-09 NOTE — Lactation Note (Signed)
This note was copied from the chart of Lynn Mendez Kump. Lactation Consultation Note  Patient Name: Lynn Mendez Tillery ZOXWR'UToday's Date: 04/09/2015 Reason for consult: Follow-up assessment Baby at 40 hr of life and mom reports feeding are going well. She did state that both nipples are sore and look red. Given comfort gels. Mom is able to manually express, colostrum noted bilaterally. Reviewed feeding frequency, voids, baby behavior, breast changes, and nipple care. Mom is aware of OP services and support group.   Maternal Data Has patient been taught Hand Expression?: Yes  Feeding Feeding Type: Breast Fed Length of feed: 10 min  LATCH Score/Interventions                      Lactation Tools Discussed/Used     Consult Status Consult Status: Follow-up Date: 04/10/15 Follow-up type: In-patient    Rulon Eisenmengerlizabeth E Shoua Ulloa 04/09/2015, 10:11 PM

## 2015-04-10 MED ORDER — IBUPROFEN 600 MG PO TABS: 600.0000 mg | ORAL_TABLET | Freq: Four times a day (QID) | ORAL | Status: DC

## 2015-04-10 NOTE — Discharge Summary (Signed)
Obstetric Discharge Summary Reason for Admission: onset of labor Prenatal Procedures: ultrasound Intrapartum Procedures: spontaneous vaginal delivery Postpartum Procedures: none Complications-Operative and Postpartum: 2 degree perineal laceration HEMOGLOBIN  Date Value Ref Range Status  04/09/2015 9.7* 12.0 - 15.0 g/dL Final   HCT  Date Value Ref Range Status  04/09/2015 29.8* 36.0 - 46.0 % Final    Physical Exam:  General: alert and cooperative Lochia: appropriate Uterine Fundus: firm Incision: healing well DVT Evaluation: No evidence of DVT seen on physical exam. Negative Homan's sign. No cords or calf tenderness. No significant calf/ankle edema.  Discharge Diagnoses: Term Pregnancy-delivered  Discharge Information: Date: 04/10/2015 Activity: pelvic rest Diet: routine Medications: PNV and Ibuprofen Condition: stable Instructions: refer to practice specific booklet Discharge to: home   Newborn Data: Live born female  Birth Weight: 6 lb 14.8 oz (3140 g) APGAR: 8, 9  Home with mother.  Turquoise Esch G 04/10/2015, 7:59 AM

## 2015-05-17 ENCOUNTER — Ambulatory Visit (HOSPITAL_COMMUNITY)
Admission: RE | Admit: 2015-05-17 | Discharge: 2015-05-17 | Disposition: A | Discharge status: Home / Self Care | Payer: BLUE CROSS/BLUE SHIELD | Source: Ambulatory Visit | Attending: Obstetrics and Gynecology | Admitting: Obstetrics and Gynecology

## 2015-05-17 NOTE — Lactation Note (Signed)
Lactation Consult: Weight today 8 lbs 5.7 oz 3784 g   Lynn Mendez has gained 5.7 oz in 8 days. Mom reports Lynn Mendez feeds for about 20 min only on one breast at a feeding. Reports baby goes off to sleep. Reviewed awakening techniques with mom. Encouraged to try to nurse on both breasts at every feeding. To nurse on the first breast 15-20 min of good deep sucks to soften breast, then burp baby and offer the second breast. Mom doing well with positioning baby. Reports no pain with nursing. Reports she is having some trouble getting the baby to take a bottle. Encouraged to let dad or grandmother offer the bottle instead of mom. No further questions at present. [facility] make sure baby is continuing to gain weight. To call prn  Mother's reason for visit:  Baby only gained 1/2 lb in 2 Dorothyann Mourer- Dr Chestine Sporelark suggested she come Visit Type:  Feeding assessment  Consult:  Initial Lactation Consultant:  Pamelia HoitWeeks, Raeghan Demeter D  ________________________________________________________________________ Joan FloresBaby's Name: Lynn SkeansElayna Mackenzie Student Date of Birth: 04/08/2017 Pediatrician: Chestine Sporelark Gender: female Gestational Age: 3021w0d (At Birth) Birth Weight: 6 lb 14.8 oz (3140 g) Weight at Discharge: Weight: 6 lb 10.2 oz (3010 g)Date of Discharge: 04/10/2015 Filed Weights   04/08/15 0518 04/09/15 0025 04/09/15 2339  Weight: 110.8 oz 6 lb 12.6 oz (3080 g) 6 lb 10.2 oz (3010 g)     Weight today 8 lbs 5.7 oz 3784 g   ________________________________________________________________________  Mother's Name: [name]  Breastfeeding Experience:  P1   ________________________________________________________________________  Breastfeeding History (Post Discharge)  Frequency of breastfeeding:  q 2-3 sometimes goes 4-5 hours at night  Duration of feeding:  20 min    Pumping  Type of pump:  Medela pump in style Frequency:  Once/day  Volume: 3-5 oz  Infant  Intake and Output Assessment  Voids:  Lots in 24 hrs.  Color:  Clear yellow- had 2 voids while here for appointment Stools:  3-5 in 24 hrs.  Color:  Yellow  ________________________________________________________________________  Maternal Breast Assessment  Breast:  Filling Nipple:  Erect  _______________________________________________________________________ Feeding Assessment/Evaluation  Initial feeding assessment:  Infant's oral assessment:  WNL  Positioning:  Cradle Left breast  LATCH documentation:  Latch:  2 = Grasps breast easily, tongue down, lips flanged, rhythmical sucking.  Audible swallowing:  2 = Spontaneous and intermittent  Type of nipple:  2 = Everted at rest and after stimulation  Comfort (Breast/Nipple):  1 = Filling, red/small blisters or bruises, mild/mod discomfort  Hold (Positioning):  2 = No assistance needed to correctly position infant at breast  LATCH score:  9  Attached assessment:  Deep  Lips flanged:  Yes.    Lips untucked:  No.  Suck assessment:  Nutritive   Pre-feed weight:  3784 g 8-5.4oz Post-feed weight:  3868 g8- 8.4 oz Amount transferred:  84 ml Amount supplemented:   0 ml   Pre-feed weight:  3868 g  8- 8.4 oz Post-feed weight:  3890 g  8- 9.2 oz Amount transferred:  22 ml  Total amount pumped post feed:  Did not pump since baby nursed on both breasts  Total amount transferred:  106 ml Total supplement given: 0 ml

## 2015-07-03 ENCOUNTER — Telehealth (HOSPITAL_COMMUNITY): Payer: Self-pay | Admitting: Lactation Services

## 2015-07-03 NOTE — Telephone Encounter (Signed)
Mom is back to work and noticing that she pumps only 2-3 oz after her 1st pumping session (which is 4.5 - 6oz). Mom reassured that 2.5 - 3.0 oz/pumping session is considered normal. Mom concerned that she will run out of milk. Mom encouraged to find out if babysitter is doing "paced-bottle feeding" to ensure that her daughter is not inadvertently being over-fed. Mom may also contact OB/CNM to let them know that she now has menses every other week since she was put on the minipill (Mom does not feel that her milk supply decreased by going on the minipill).  Glenetta Hew, RN, IBCLC

## 2015-08-04 NOTE — Telephone Encounter (Signed)
Opened in error

## 2015-08-13 ENCOUNTER — Ambulatory Visit (HOSPITAL_COMMUNITY)
Admission: RE | Admit: 2015-08-13 | Discharge: 2015-08-13 | Disposition: A | Discharge status: Home / Self Care | Payer: BLUE CROSS/BLUE SHIELD | Source: Ambulatory Visit | Attending: Obstetrics and Gynecology | Admitting: Obstetrics and Gynecology

## 2015-08-13 NOTE — Lactation Note (Addendum)
Lactation Consult  Mother's reason for visit: Slow weight gain, referred by Pediatrician  Visit Type:  Outpatient Appointment Notes:  4 months old, slow weight gain and decreased milk supply Consult:  Follow-Up Lactation Consultant:  Lynn Mendez, Lynn Mendez ________________________________________________________________________ Lynn FloresBaby's Name: Lynn LeberElayna Mendez   Mother's Name:  Lynn Mendez Date of Birth: [birth year] Pediatrician:Dr Lynn Mendez Peds Gender: female Gestational Age: 7541 weeks Birth Weight:6 lbs 14 oz   DOB- [birth year] Weight at Discharge:6 lbs 10 oz Date of Discharge: 04/10/15 There were no vitals filed for this visit.  Weight today: 11 lbs 0.1 oz,( baby gained an average of 3.8 oz per week since birth.)  Assessment- 1- Baby fed on both breasts for total 30 minutes and transferred 58 ml.    Baby latched deeply with a wide gape of mouth, and was nutritive on the breast for 10-15 minutes.  Baby seemed contented on the breast, and post feeding.  [name] feeds baby every 2-3 hrs at home.  At 27 months of age, baby just recently began 1-2 rice cereal feedings.  She is tolerating this well per Mom. 2- Lynn Mendez states she had moderate amounts of vaginal bleeding for 6 weeks post partum, and then every other week, continuing presently.  Started on "mini-pill" contraceptive at 27 weeks postpartum (which can reduce milk supply), at one point being advised to "double up" her dose due to her continued vaginal bleeding.  She discontinued the pill  2 days ago noticing her milk supply decreased after beginning the contraceptive pill.  [name] denies fever, or painful cramping, but did share that she has high pain tolerance.  Shared with Lynn Mendez that a retained placental fragments can cause milk supply issues.  Recommended she talk to her OB about her continued vaginal bleeding, and her decrease in milk supply. 3- Lynn Mendez had previously been pumping 2-3 times (only  one breast at a time) when at work only.  Encouraged her to double pump as this increases her prolactin hormone level higher  Plan- 1- Breastfeed 8 times/24 hrs or on cue 2- Pump BOTH breasts after Lynn breastfeeds- pump 15-20 mins 3-Supplement baby with pumped breast milk by bottle,  mixing formula to equal 1 1/2-2 oz per feeding 4- Power pump per handout given 5- If not breastfeeding, feed baby 3 1/2-4 oz per feeding (8 feeds per 24 hrs) or 4 1/2- 5 oz per feeding (6 feedings per 24 hrs) formula+/expressed breast milk by bottle 6- Follow up appointment recommended with Lactation within 2 weeks.  Lynn Mendez to call to schedule this, as she is concerned about cost. 7- Recommended Breastfeeding Support Groups ________________________________________________________________________  Mother's Name: Lynn Mendez Type of delivery:  Vaginal   Breastfeeding Experience:  First baby Maternal Medical Conditions:  Vaginal bleeding for 6 weeks post delivery, and every other week after that Maternal Medications:  PNV, ibuprofen for headaches, Camila Rx (mini pill) at 9 weeks post partum, doubled up dose recently for a week.  Discontinued 2 days ago.   ________________________________________________________________________  Breastfeeding History (Post Discharge)  Frequency of breastfeeding:  2-3 hrs when off work or > 8 times in 24 hrs Duration of feeding:  20-40 minutes  Supplementation  Formula:  Volume 120 ml Frequency: 2X day at daycare Total volume per day:  240 ml Breastmilk:  Volume 30-120 ml Frequency:  2 X at day care Total volume per day:  60-240 ml Method:  Bottle,  Pumping Type of pump:  Medela pump in style Frequency:  2-3 times while at work Volume:  4-5 oz first pumping, and then 2-3 oz each Infant Intake and Output Assessment Voids:>8 in 24 hrs.  Color:  Clear yellow Stools:  1 in 24 hrs.  Color:   Yellow ________________________________________________________________________ Maternal Breast Assessment Breast:  Soft Nipple:  Flat Pain level:  0 ______________________________________________________________________ Feeding Assessment/Evaluation Initial feeding assessment:  Infant's oral assessment:  WNL Positioning:  Cross cradle Left breast LATCH documentation:  Latch:  2 = Grasps breast easily, tongue down, lips flanged, rhythmical sucking.  Audible swallowing:  2 = Spontaneous and intermittent  Type of nipple:  2 = Everted at rest and after stimulation  Comfort (Breast/Nipple):  2 = Soft / non-tender  Hold (Positioning):  2 = No assistance needed to correctly position infant at breast  LATCH score:  10 Attached assessment:  Deep  Lips flanged:  Yes.    Lips untucked:  No. Suck assessment:  Displays both Tools:  Pump Pre-feed weight:  4992 g  Post-feed weight:  5006 g Amount transferred:  14 ml Additional Feeding Assessment -  Infant's oral assessment:  WNL Positioning:  Cross cradle Right breast LATCH documentation:  Latch:  2 = Grasps breast easily, tongue down, lips flanged, rhythmical sucking.  Audible swallowing:  1 = A few with stimulation  Type of nipple:  2 = everted at rest  Comfort (Breast/Nipple):  2 = Soft / non-tender  Hold (Positioning):  2 = No assistance needed to correctly position infant at breast  LATCH score:  9 Attached assessment:  Deep  Lips flanged:  Yes.    Lips untucked:  No. Suck assessment:  Displays both Pre-feed weight:  5006 g   Post-feed weight:  5050 g  Amount transferred: 44 ml Total amount pumped post feed:    L  10 ml  Total amount transferred:  58 ml

## 2016-02-29 IMAGING — CT CT ABD-PELV W/O CM
2 of 4 series · 16 of 46 positions shown, 18 images · non-contrast
Comparison: None

ADDENDUM:
Error in initial impression.

Impression should be corrected to state:
CLINICAL DATA: RIGHT lower quadrant pain, nausea, vomiting,
personal history of pancreatitis and cholecystectomy
EXAM:
CT ABDOMEN AND PELVIS WITHOUT CONTRAST
TECHNIQUE: Multidetector CT imaging of the abdomen and pelvis was performed
following the standard protocol without IV contrast. Sagittal and
coronal MPR images reconstructed from axial data set.

[Series 2: abd/pelvis 5.0 b31f · axial · 0.65mm/px · z∈[+802,+1187]mm · 13 of 91 slices shown, 15 images]
[im 7/91  soft-tissue]
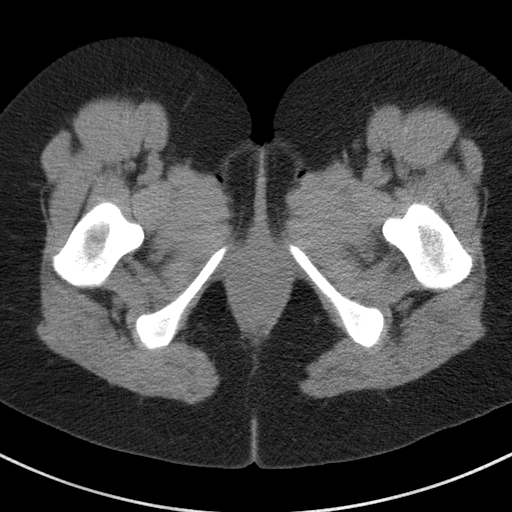
[im 7/91  bone]
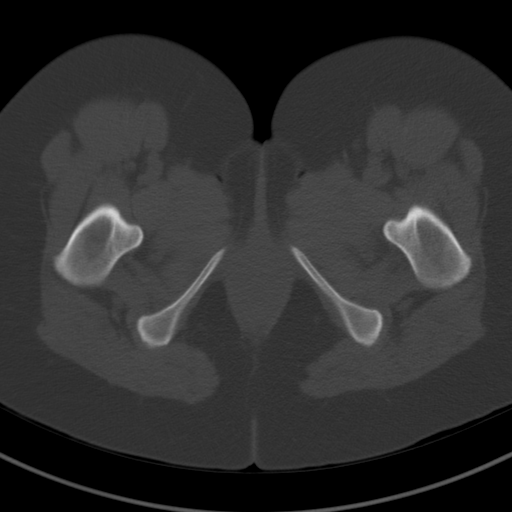
[im 13/91  soft-tissue]
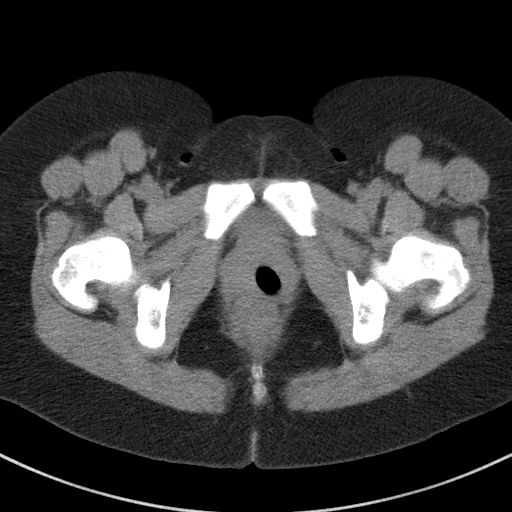
[im 20/91  soft-tissue]
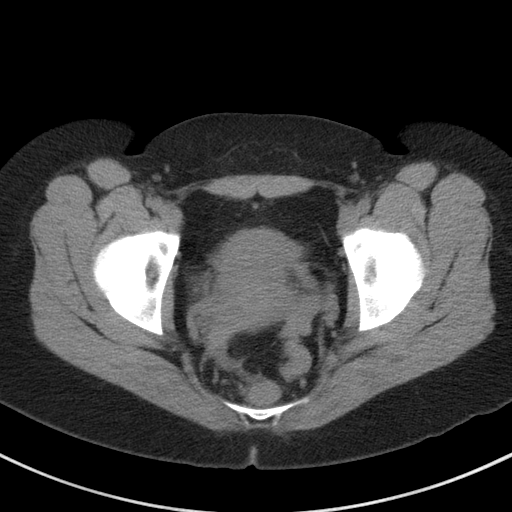
[im 26/91  soft-tissue]
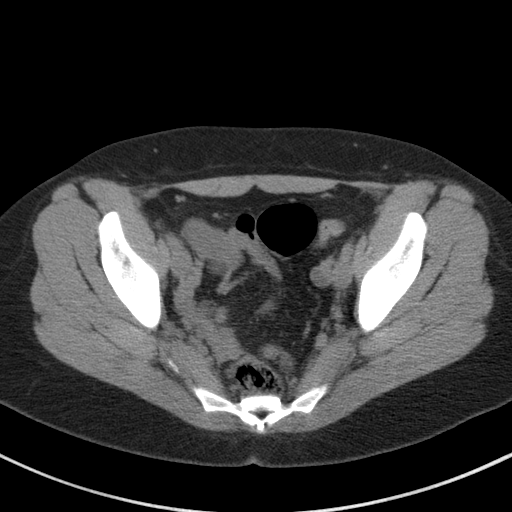
[im 33/91  soft-tissue]
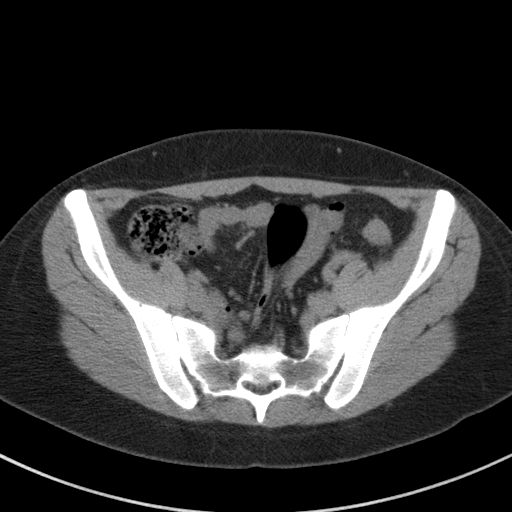
[im 39/91  soft-tissue]
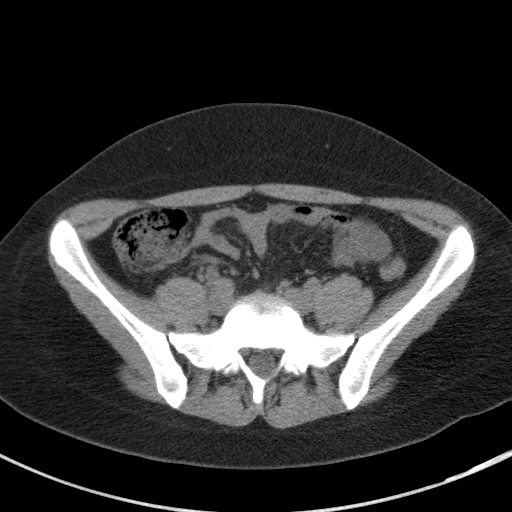
[im 46/91  soft-tissue]
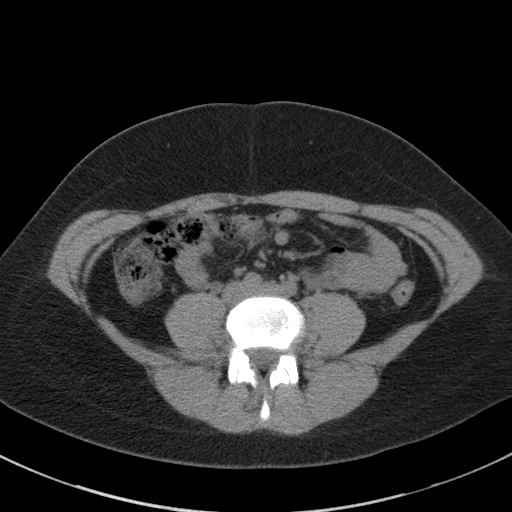
[im 52/91  soft-tissue]
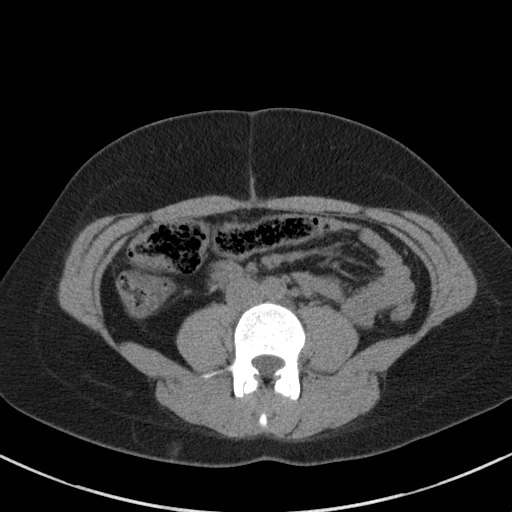
[im 58/91  soft-tissue]
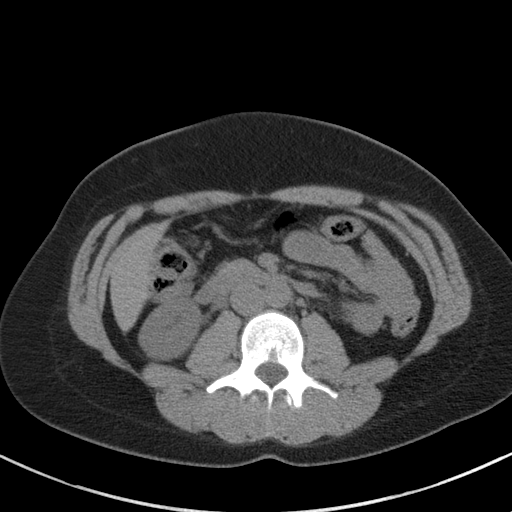
[im 58/91  bone]
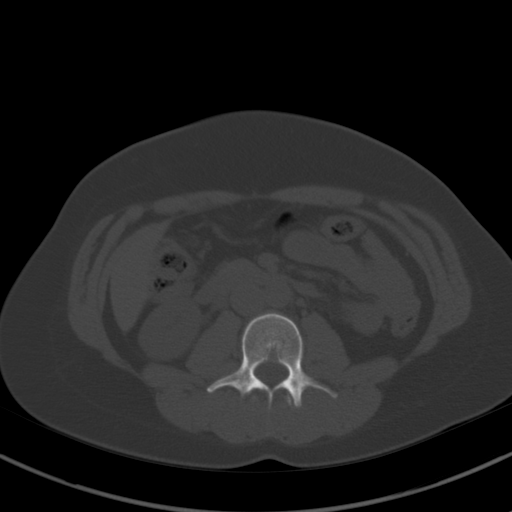
[im 65/91  soft-tissue]
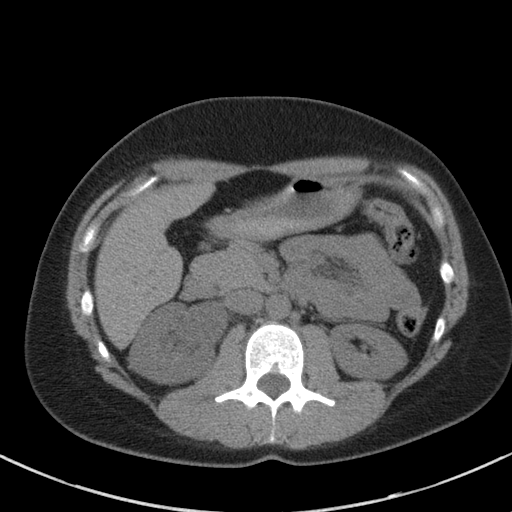
[im 71/91  soft-tissue]
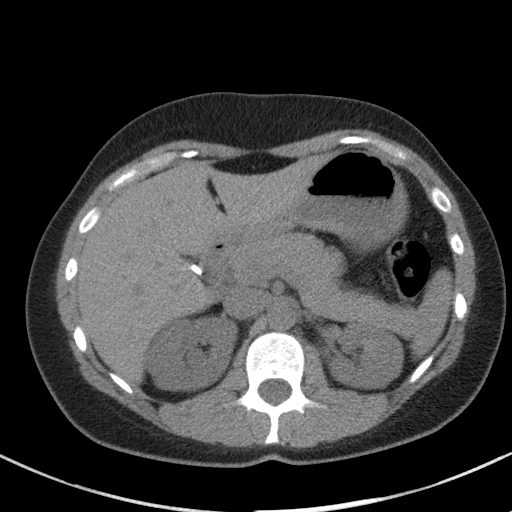
[im 78/91  soft-tissue]
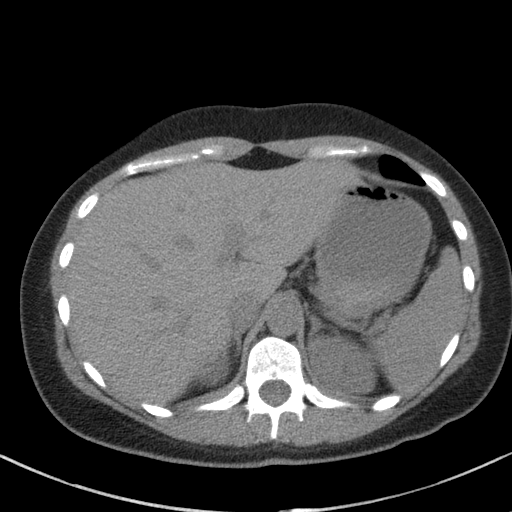
[im 84/91  soft-tissue]
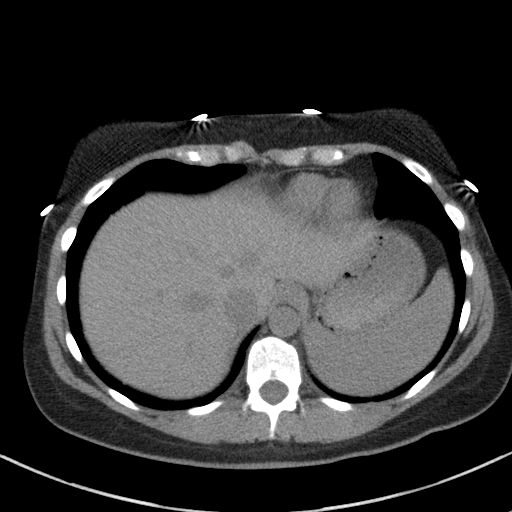

[Series 5: abd/pelvis 3.0 coronal · coronal · 0.68mm/px · 3 of 78 slices shown]
[im 26/78  soft-tissue]
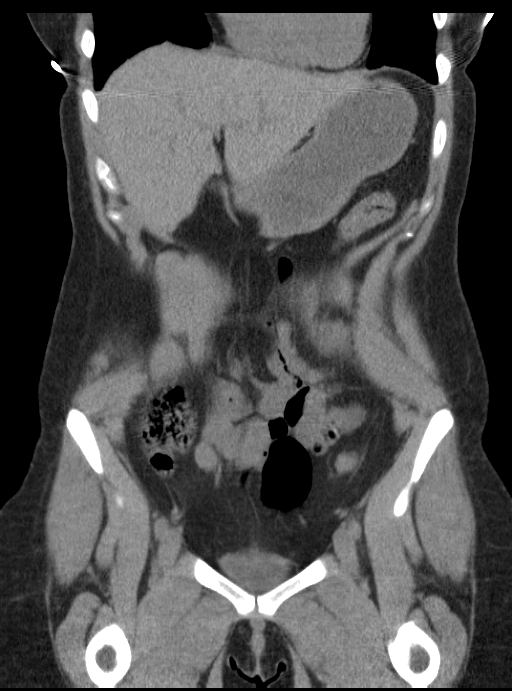
[im 35/78  soft-tissue]
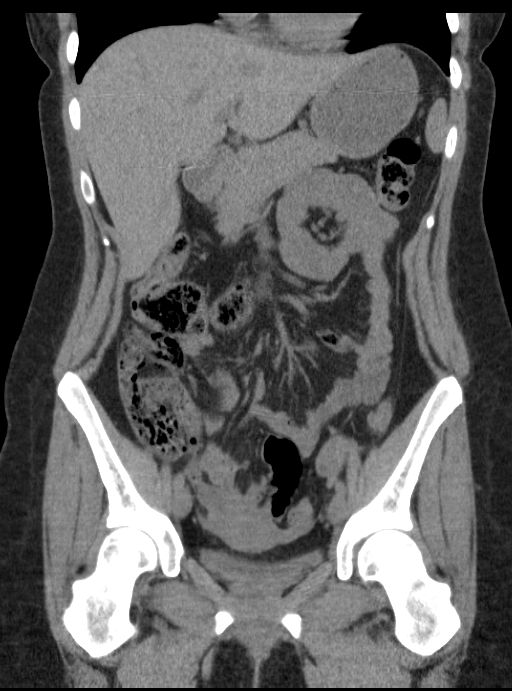
[im 43/78  soft-tissue]
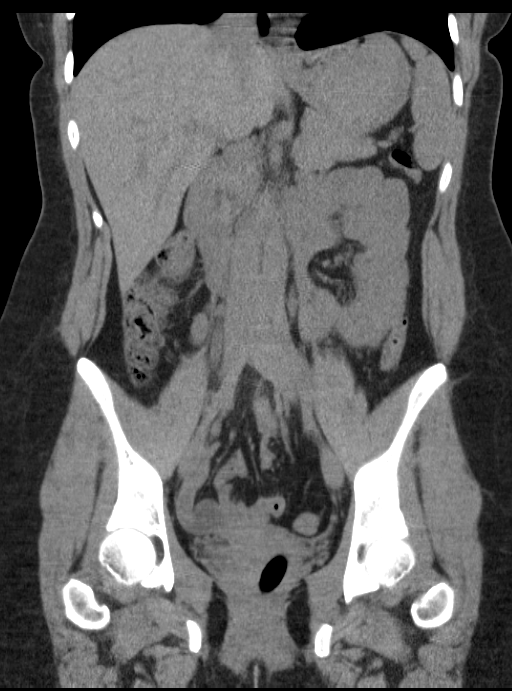

[16 of 46 positions shown; findings below may reference images not displayed]

IMPRESSION: RIGHT hydronephrosis and hydroureter secondary to a 2 mm RIGHT UPJ
calculus.
FINDINGS: Lung bases clear.

RIGHT hydronephrosis and hydroureter secondary to a 2 mm calculus at
the RIGHT ureterovesical junction.

No additional urinary tract calcifications.

Upper normal hepatic attenuation.

Within limits of technique, liver, spleen, pancreas, kidneys, and
adrenal glands otherwise normal.

Stomach and bowel loops grossly unremarkable for exam lacking IV and
oral contrast.

Normal appendix, uterus, adnexae, and LEFT ureter.

No mass, adenopathy, free air or hernia.

Tiny amount of nonspecific low-attenuation fluid in pelvis,
potentially physiologic.

Tampon in vagina.

Osseous structures unremarkable.
IMPRESSION: LEFT hydronephrosis and hydroureter secondary to a 2 mm RIGHT UVJ
calculus.

## 2016-10-06 ENCOUNTER — Ambulatory Visit: Payer: BLUE CROSS/BLUE SHIELD | Admitting: Family

## 2016-10-07 ENCOUNTER — Ambulatory Visit (INDEPENDENT_AMBULATORY_CARE_PROVIDER_SITE_OTHER): Payer: BLUE CROSS/BLUE SHIELD | Admitting: Family

## 2016-10-07 ENCOUNTER — Encounter: Payer: Self-pay | Admitting: Family

## 2016-10-07 VITALS — BP 114/70 | HR 84 | Temp 98.2°F | Resp 16 | Ht 67.0 in | Wt 209.8 lb

## 2016-10-07 DIAGNOSIS — R05 Cough: Secondary | ICD-10-CM | POA: Diagnosis not present

## 2016-10-07 DIAGNOSIS — G43009 Migraine without aura, not intractable, without status migrainosus: Secondary | ICD-10-CM | POA: Diagnosis not present

## 2016-10-07 DIAGNOSIS — R059 Cough, unspecified: Secondary | ICD-10-CM | POA: Insufficient documentation

## 2016-10-07 MED ORDER — PROMETHAZINE-CODEINE 6.25-10 MG/5ML PO SYRP: 5.0000 mL | ORAL_SOLUTION | Freq: Four times a day (QID) | ORAL | 0 refills | Status: DC | PRN

## 2016-10-07 MED ORDER — RIZATRIPTAN BENZOATE 10 MG PO TABS: ORAL_TABLET | ORAL | 0 refills | Status: DC

## 2016-10-07 MED ORDER — AMOXICILLIN-POT CLAVULANATE 875-125 MG PO TABS: 1.0000 | ORAL_TABLET | Freq: Two times a day (BID) | ORAL | 0 refills | Status: DC

## 2016-10-07 NOTE — Assessment & Plan Note (Signed)
Long-standing history of migraine headaches currently maintained with over-the-counter medications and increasing difficulty with management with no increased intensity or frequency. Neurological exam normal. Start Maxalt. Encouraged to monitor for triggers. Follow-up for worsening or increased frequency.

## 2016-10-07 NOTE — Progress Notes (Signed)
Subjective:    Patient ID: Lynn Mendez, female    DOB: 1990/09/30, 26 y.o.   MRN: [id number]  Chief Complaint  Patient presents with  . Establish Care    migraines, x4 days, cough and congestion    HPI:  Lynn Mendez is a 26 y.o. female who  has a past medical history of Allergy; Chicken pox; Kidney stones; Migraines; Pancreatitis; and PONV (postoperative nausea and vomiting). and presents today for an office visit to establish care.   1.) Headaches - Previously diagnosed with migraine htheadaches at the age of 1 when she was in [place] believed to be from genetics as most of her father's family had migraines. Currently maintained on OTC ibuprofen and Excedrin which used to work well but has slowly been as effective. Headaches rare described with throbbing with associated sensitivity to light or sound without nausea or vomiting. Denies aura. Weather is a trigger. Has not had any imaging or been on prescription medication. Frequency of the headaches varies. No changes in intensity or frequency.    2.) Cough - This is a new problem. Associated symptoms of cough, congestion, sinus pressure, and headache. No fevers. Modifying factors include Mucinex and nasal sprays which help a little. Symptoms have been going on for about 5 days with the course of the symptoms staying about the same. Severity of the cough is enough to disturb sleep. No recent antibiotics.     No Known Allergies    Outpatient Medications Prior to Visit  Medication Sig Dispense Refill  . ibuprofen (ADVIL,MOTRIN) 600 MG tablet Take 1 tablet (600 mg total) by mouth every 6 (six) hours. 30 tablet 1  . Prenatal Vit-Fe Fumarate-FA (PRENATAL MULTIVITAMIN) TABS tablet Take 1 tablet by mouth daily at 12 noon.     No facility-administered medications prior to visit.      Past Medical History:  Diagnosis Date  . Allergy   . Chicken pox   . Kidney stones   . Migraines   . Pancreatitis   . PONV (postoperative nausea  and vomiting)       Past Surgical History:  Procedure Laterality Date  . CHOLECYSTECTOMY        Family History  Problem Relation Age of Onset  . Hypertension Mother   . Diabetes Mother   . Cancer Paternal Grandmother   . Lung cancer Paternal Grandmother   . Skin cancer Paternal Grandmother   . Heart attack Paternal Grandfather       Social History   Social History  . Marital status: Married    Spouse name: N/A  . Number of children: 1  . Years of education: 91   Occupational History  . Front Architect    Social History Main Topics  . Smoking status: Never Smoker  . Smokeless tobacco: Never Used  . Alcohol use No  . Drug use: No  . Sexual activity: Yes   Other Topics Concern  . Not on file   Social History Narrative   Fun/Hobby: Reading, sewing   Denies abuse and feels safe at home       Review of Systems  Constitutional: Negative for chills and fever.  HENT: Positive for congestion, sinus pressure and sore throat.   Respiratory: Positive for cough. Negative for chest tightness, shortness of breath and wheezing.   Gastrointestinal: Negative for nausea and vomiting.  Neurological: Positive for headaches.       Objective:    BP 114/70 (BP Location: Left Arm, Patient Position: Sitting,  Cuff Size: Large)   Pulse 84   Temp 98.2 F (36.8 C) (Oral)   Resp 16   Ht 5\' 7"  (1.702 m)   Wt 209 lb 12.8 oz (95.2 kg)   SpO2 97%   BMI 32.86 kg/m  Nursing note and vital signs reviewed.  Physical Exam  Constitutional: She is oriented to person, place, and time. She appears well-developed and well-nourished. No distress.  HENT:  Right Ear: Hearing, tympanic membrane, external ear and ear canal normal.  Left Ear: Hearing, tympanic membrane, external ear and ear canal normal.  Nose: Nose normal. Right sinus exhibits no maxillary sinus tenderness and no frontal sinus tenderness. Left sinus exhibits no maxillary sinus tenderness and no frontal sinus  tenderness.  Mouth/Throat: Uvula is midline, oropharynx is clear and moist and mucous membranes are normal.  Eyes: Conjunctivae and EOM are normal. Pupils are equal, round, and reactive to light.  Neck: Neck supple.  Cardiovascular: Normal rate, regular rhythm, normal heart sounds and intact distal pulses.  Exam reveals no gallop and no friction rub.   No murmur heard. Pulmonary/Chest: Effort normal and breath sounds normal. No respiratory distress. She has no wheezes. She has no rales. She exhibits no tenderness.  Lymphadenopathy:    She has no cervical adenopathy.  Neurological: She is alert and oriented to person, place, and time. No cranial nerve deficit.  Skin: Skin is warm and dry.  Psychiatric: She has a normal mood and affect. Her behavior is normal. Judgment and thought content normal.        Assessment & Plan:   Problem List Items Addressed This Visit      Cardiovascular and Mediastinum   Migraine without aura and without status migrainosus, not intractable - Primary    Long-standing history of migraine headaches currently maintained with over-the-counter medications and increasing difficulty with management with no increased intensity or frequency. Neurological exam normal. Start Maxalt. Encouraged to monitor for triggers. Follow-up for worsening or increased frequency.      Relevant Medications   aspirin-acetaminophen-caffeine (EXCEDRIN MIGRAINE) 250-250-65 MG tablet   rizatriptan (MAXALT) 10 MG tablet     Other   Cough    New onset cough and congestion consistent with sinusitis. Recommend continuing over-the-counter medications as needed for symptom relief and supportive care. Start promethazine-codeine as needed for cough and sleep. Written prescription for Augmentin provided with instructions for watchful waiting if symptoms worsen in the next 48-72 hours. Follow-up if symptoms worsen or do not improve.          I have discontinued Ms. Viles's prenatal  multivitamin. I am also having her start on promethazine-codeine, amoxicillin-clavulanate, and rizatriptan. Additionally, I am having her maintain her ibuprofen, aspirin-acetaminophen-caffeine, and levonorgestrel-ethinyl estradiol.   Meds ordered this encounter  Medications  . aspirin-acetaminophen-caffeine (EXCEDRIN MIGRAINE) 250-250-65 MG tablet    Sig: Take by mouth every 6 (six) hours as needed for headache.  Marland Kitchen DISCONTD: levonorgestrel-ethinyl estradiol (LARISSIA) 0.1-20 MG-MCG tablet    Sig: Take 1 tablet by mouth daily.  Marland Kitchen levonorgestrel-ethinyl estradiol (LARISSIA) 0.1-20 MG-MCG tablet    Sig: Take 1 tablet by mouth daily.  . promethazine-codeine (PHENERGAN WITH CODEINE) 6.25-10 MG/5ML syrup    Sig: Take 5 mLs by mouth every 6 (six) hours as needed.    Dispense:  180 mL    Refill:  0    Order Specific Question:   Supervising Provider    Answer:   Hillard Danker A [[id number]]  . amoxicillin-clavulanate (AUGMENTIN) 875-125 MG tablet  Sig: Take 1 tablet by mouth 2 (two) times daily.    Dispense:  14 tablet    Refill:  0    Order Specific Question:   Supervising Provider    Answer:   Hillard DankerRAWFORD, ELIZABETH A [[id number]]  . rizatriptan (MAXALT) 10 MG tablet    Sig: Take 0.5-1 tablet by mouth as needed at the onset of a headache. May repeat in 2 hours if needed. Max 2 pills in 1 day.    Dispense:  10 tablet    Refill:  0    Order Specific Question:   Supervising Provider    Answer:   Hillard DankerRAWFORD, ELIZABETH A [[id number]]     Follow-up: Return in about 3 months (around 01/07/2017), or if symptoms worsen or fail to improve.  Jeanine Luzalone, Tranika Scholler, FNP

## 2016-10-07 NOTE — Assessment & Plan Note (Signed)
New onset cough and congestion consistent with sinusitis. Recommend continuing over-the-counter medications as needed for symptom relief and supportive care. Start promethazine-codeine as needed for cough and sleep. Written prescription for Augmentin provided with instructions for watchful waiting if symptoms worsen in the next 48-72 hours. Follow-up if symptoms worsen or do not improve.

## 2016-10-07 NOTE — Patient Instructions (Signed)
Thank you for choosing [facility].  SUMMARY AND INSTRUCTIONS:  Recommend Aleve-D to help with congestion.  Continue with Mucinex as needed.  If your symptoms worsen in the next 48-72 hours, start the Augmentin.   Start the Maxalt as needed for your headaches. Take 0.5-1 tablet as needed at the start of headache and may repeat 2 hours later if needed.  Schedule a time for your physical at your convenience.  Weight loss medications: Phentermine, Belviq, Contrave, Qsymia, and Saxenda.   Eat a caloric intake of about 1200-1500 calories per day with higher levels of protein.  Medication:  Your prescription(s) have been submitted to your pharmacy or been printed and provided for you. Please take as directed and contact our office if you believe you are having problem(s) with the medication(s) or have any questions.  Follow up:  If your symptoms worsen or fail to improve, please contact our office for further instruction, or in case of emergency go directly to the emergency room at the closest medical facility.    General Recommendations:    Please drink plenty of fluids.  Get plenty of rest   Sleep in humidified air  Use saline nasal sprays  Netti pot   OTC Medications:  Decongestants - helps relieve congestion   Flonase (generic fluticasone) or Nasacort (generic triamcinolone) - please make sure to use the "cross-over" technique at a 45 degree angle towards the opposite eye as opposed to straight up the nasal passageway.   Sudafed (generic pseudoephedrine - Note this is the one that is available behind the pharmacy counter); Products with phenylephrine (-PE) may also be used but is often not as effective as pseudoephedrine.   If you have HIGH BLOOD PRESSURE - Coricidin HBP; AVOID any product that is -D as this contains pseudoephedrine which may increase your blood pressure.  Afrin (oxymetazoline) every 6-8 hours for up to 3 days.   Allergies - helps relieve  runny nose, itchy eyes and sneezing   Claritin (generic loratidine), Allegra (fexofenidine), or Zyrtec (generic cyrterizine) for runny nose. These medications should not cause drowsiness.  Note - Benadryl (generic diphenhydramine) may be used however may cause drowsiness  Cough -   Delsym or Robitussin (generic dextromethorphan)  Expectorants - helps loosen mucus to ease removal   Mucinex (generic guaifenesin) as directed on the package.  Headaches / General Aches   Tylenol (generic acetaminophen) - DO NOT EXCEED 3 grams (3,000 mg) in a 24 hour time period  Advil/Motrin (generic ibuprofen)   Sore Throat -   Salt water gargle   Chloraseptic (generic benzocaine) spray or lozenges / Sucrets (generic dyclonine)    Sinusitis Sinusitis is redness, soreness, and inflammation of the paranasal sinuses. Paranasal sinuses are air pockets within the bones of your face (beneath the eyes, the middle of the forehead, or above the eyes). In healthy paranasal sinuses, mucus is able to drain out, and air is able to circulate through them by way of your nose. However, when your paranasal sinuses are inflamed, mucus and air can become trapped. This can allow bacteria and other germs to grow and cause infection. Sinusitis can develop quickly and last only a short time (acute) or continue over a long period (chronic). Sinusitis that lasts for more than 12 weeks is considered chronic.  CAUSES  Causes of sinusitis include:  Allergies.  Structural abnormalities, such as displacement of the cartilage that separates your nostrils (deviated septum), which can decrease the air flow through your nose and sinuses and  affect sinus drainage.  Functional abnormalities, such as when the small hairs (cilia) that line your sinuses and help remove mucus do not work properly or are not present. SIGNS AND SYMPTOMS  Symptoms of acute and chronic sinusitis are the same. The primary symptoms are pain and pressure  around the affected sinuses. Other symptoms include:  Upper toothache.  Earache.  Headache.  Bad breath.  Decreased sense of smell and taste.  A cough, which worsens when you are lying flat.  Fatigue.  Fever.  Thick drainage from your nose, which often is green and may contain pus (purulent).  Swelling and warmth over the affected sinuses. DIAGNOSIS  Your health care provider will perform a physical exam. During the exam, your health care provider may:  Look in your nose for signs of abnormal growths in your nostrils (nasal polyps).  Tap over the affected sinus to check for signs of infection.  View the inside of your sinuses (endoscopy) using an imaging device that has a light attached (endoscope). If your health care provider suspects that you have chronic sinusitis, one or more of the following tests may be recommended:  Allergy tests.  Nasal culture. A sample of mucus is taken from your nose, sent to a lab, and screened for bacteria.  Nasal cytology. A sample of mucus is taken from your nose and examined by your health care provider to determine if your sinusitis is related to an allergy. TREATMENT  Most cases of acute sinusitis are related to a viral infection and will resolve on their own within 10 days. Sometimes medicines are prescribed to help relieve symptoms (pain medicine, decongestants, nasal steroid sprays, or saline sprays).  However, for sinusitis related to a bacterial infection, your health care provider will prescribe antibiotic medicines. These are medicines that will help kill the bacteria causing the infection.  Rarely, sinusitis is caused by a fungal infection. In theses cases, your health care provider will prescribe antifungal medicine. For some cases of chronic sinusitis, surgery is needed. Generally, these are cases in which sinusitis recurs more than 3 times per year, despite other treatments. HOME CARE INSTRUCTIONS   Drink plenty of water. Water  helps thin the mucus so your sinuses can drain more easily.  Use a humidifier.  Inhale steam 3 to 4 times a day (for example, sit in the bathroom with the shower running).  Apply a warm, moist washcloth to your face 3 to 4 times a day, or as directed by your health care provider.  Use saline nasal sprays to help moisten and clean your sinuses.  Take medicines only as directed by your health care provider.  If you were prescribed either an antibiotic or antifungal medicine, finish it all even if you start to feel better. SEEK IMMEDIATE MEDICAL CARE IF:  You have increasing pain or severe headaches.  You have nausea, vomiting, or drowsiness.  You have swelling around your face.  You have vision problems.  You have a stiff neck.  You have difficulty breathing. MAKE SURE YOU:   Understand these instructions.  Will watch your condition.  Will get help right away if you are not doing well or get worse. Document Released: 04/27/2005 Document Revised: 09/11/2013 Document Reviewed: 05/12/2011 [facility] Patient Information 2015 [place], [place]. This information is not intended to replace advice given to you by your health care provider. Make sure you discuss any questions you have with your health care provider.     Why follow it? Research shows. . Those  who follow the Mediterranean diet have a reduced risk of heart disease  . The diet is associated with a reduced incidence of Parkinson's and Alzheimer's diseases . People following the diet may have longer life expectancies and lower rates of chronic diseases  . The Dietary Guidelines for Americans recommends the Mediterranean diet as an eating plan to promote health and prevent disease  What Is the Mediterranean Diet?  . Healthy eating plan based on typical foods and recipes of Mediterranean-style cooking . The diet is primarily a plant based diet; these foods should make up a majority of meals   Starches - Plant based foods  should make up a majority of meals - They are an important sources of vitamins, minerals, energy, antioxidants, and fiber - Choose whole grains, foods high in fiber and minimally processed items  - Typical grain sources include wheat, oats, barley, corn, brown rice, bulgar, farro, millet, polenta, couscous  - Various types of beans include chickpeas, lentils, fava beans, black beans, white beans   Fruits  Veggies - Large quantities of antioxidant rich fruits & veggies; 6 or more servings  - Vegetables can be eaten raw or lightly drizzled with oil and cooked  - Vegetables common to the traditional Mediterranean Diet include: artichokes, arugula, beets, broccoli, brussel sprouts, cabbage, carrots, celery, collard greens, cucumbers, eggplant, kale, leeks, lemons, lettuce, mushrooms, okra, onions, peas, peppers, potatoes, pumpkin, radishes, rutabaga, shallots, spinach, sweet potatoes, turnips, zucchini - Fruits common to the Mediterranean Diet include: apples, apricots, avocados, cherries, clementines, dates, figs, grapefruits, grapes, melons, nectarines, oranges, peaches, pears, pomegranates, strawberries, tangerines  Fats - Replace butter and margarine with healthy oils, such as olive oil, canola oil, and tahini  - Limit nuts to no more than a handful a day  - Nuts include walnuts, almonds, pecans, pistachios, pine nuts  - Limit or avoid candied, honey roasted or heavily salted nuts - Olives are central to the [employer] - can be eaten whole or used in a variety of dishes   Meats Protein - Limiting red meat: no more than a few times a month - When eating red meat: choose lean cuts and keep the portion to the size of deck of cards - Eggs: approx. 0 to 4 times a week  - Fish and lean poultry: at least 2 a week  - Healthy protein sources include, chicken, [place], lean beef, lamb - Increase intake of seafood such as tuna, salmon, trout, mackerel, shrimp, scallops - Avoid or limit high fat  processed meats such as sausage and bacon  Dairy - Include moderate amounts of low fat dairy products  - Focus on healthy dairy such as fat free yogurt, skim milk, low or reduced fat cheese - Limit dairy products higher in fat such as whole or 2% milk, cheese, ice cream  Alcohol - Moderate amounts of red wine is ok  - No more than 5 oz daily for women (all ages) and men older than age 31  - No more than 10 oz of wine daily for men younger than 37  Other - Limit sweets and other desserts  - Use herbs and spices instead of salt to flavor foods  - Herbs and spices common to the traditional Mediterranean Diet include: basil, bay leaves, chives, cloves, cumin, fennel, garlic, lavender, marjoram, mint, oregano, parsley, pepper, rosemary, sage, savory, sumac, tarragon, thyme   It's not just a diet, it's a lifestyle:  . The Mediterranean diet includes lifestyle factors typical of those in  the region  . Foods, drinks and meals are best eaten with others and savored . Daily physical activity is important for overall good health . This could be strenuous exercise like running and aerobics . This could also be more leisurely activities such as walking, housework, yard-work, or taking the stairs . Moderation is the key; a balanced and healthy diet accommodates most foods and drinks . Consider portion sizes and frequency of consumption of certain foods   Meal Ideas & Options:  . Breakfast:  o Whole wheat toast or whole wheat English muffins with peanut butter & hard boiled egg o Steel cut oats topped with apples & cinnamon and skim milk  o Fresh fruit: banana, strawberries, melon, berries, peaches  o Smoothies: strawberries, bananas, greek yogurt, peanut butter o Low fat greek yogurt with blueberries and granola  o Egg white omelet with spinach and mushrooms o Breakfast couscous: whole wheat couscous, apricots, skim milk, cranberries  . Sandwiches:  o Hummus and grilled vegetables (peppers, zucchini,  squash) on whole wheat bread   o Grilled chicken on whole wheat pita with lettuce, tomatoes, cucumbers or tzatziki  o Tuna salad on whole wheat bread: tuna salad made with greek yogurt, olives, red peppers, capers, green onions o Garlic rosemary lamb pita: lamb sauted with garlic, rosemary, salt & pepper; add lettuce, cucumber, greek yogurt to pita - flavor with lemon juice and black pepper  . Seafood:  o Mediterranean grilled salmon, seasoned with garlic, basil, parsley, lemon juice and black pepper o Shrimp, lemon, and spinach whole-grain pasta salad made with low fat greek yogurt  o Seared scallops with lemon orzo  o Seared tuna steaks seasoned salt, pepper, coriander topped with tomato mixture of olives, tomatoes, olive oil, minced garlic, parsley, green onions and cappers  . Meats:  o Herbed greek chicken salad with kalamata olives, cucumber, feta  o Red bell peppers stuffed with spinach, bulgur, lean ground beef (or lentils) & topped with feta   o Kebabs: skewers of chicken, tomatoes, onions, zucchini, squash  o [place] burgers: made with red onions, mint, dill, lemon juice, feta cheese topped with roasted red peppers . Vegetarian o Cucumber salad: cucumbers, artichoke hearts, celery, red onion, feta cheese, tossed in olive oil & lemon juice  o Hummus and whole grain pita points with a greek salad (lettuce, tomato, feta, olives, cucumbers, red onion) o Lentil soup with celery, carrots made with vegetable broth, garlic, salt and pepper  o Tabouli salad: parsley, bulgur, mint, scallions, cucumbers, tomato, radishes, lemon juice, olive oil, salt and pepper.        Migraine Headache A migraine headache is a very strong throbbing pain on one side or both sides of your head. Migraines can also cause other symptoms. Talk with your doctor about what things may bring on (trigger) your migraine headaches. Follow these instructions at home: Medicines   Take over-the-counter and prescription  medicines only as told by your doctor.  Do not drive or use heavy machinery while taking prescription pain medicine.  To prevent or treat constipation while you are taking prescription pain medicine, your doctor may recommend that you:  Drink enough fluid to keep your pee (urine) clear or pale yellow.  Take over-the-counter or prescription medicines.  Eat foods that are high in fiber. These include fresh fruits and vegetables, whole grains, and beans.  Limit foods that are high in fat and processed sugars. These include fried and sweet foods. Lifestyle   Avoid alcohol.  Do not  use any products that contain nicotine or tobacco, such as cigarettes and e-cigarettes. If you need help quitting, ask your doctor.  Get at least 8 hours of sleep every night.  Limit your stress. General instructions    Keep a journal to find out what may bring on your migraines. For example, write down:  What you eat and drink.  How much sleep you get.  Any change in what you eat or drink.  Any change in your medicines.  If you have a migraine:  Avoid things that make your symptoms worse, such as bright lights.  It may help to lie down in a dark, quiet room.  Do not drive or use heavy machinery.  Ask your doctor what activities are safe for you.  Keep all follow-up visits as told by your doctor. This is important. Contact a doctor if:  You get a migraine that is different or worse than your usual migraines. Get help right away if:  Your migraine gets very bad.  You have a fever.  You have a stiff neck.  You have trouble seeing.  Your muscles feel weak or like you cannot control them.  You start to lose your balance a lot.  You start to have trouble walking.  You pass out (faint). This information is not intended to replace advice given to you by your health care provider. Make sure you discuss any questions you have with your health care provider. Document Released: 02/04/2008  Document Revised: 11/15/2015 Document Reviewed: 10/14/2015 Elsevier Interactive Patient Education  2017 [employer].

## 2016-10-11 ENCOUNTER — Encounter: Payer: Self-pay | Admitting: Family

## 2016-10-12 ENCOUNTER — Other Ambulatory Visit: Payer: Self-pay | Admitting: Family

## 2016-10-12 MED ORDER — SUMATRIPTAN SUCCINATE 100 MG PO TABS: ORAL_TABLET | ORAL | 0 refills | Status: DC

## 2016-10-12 MED ORDER — PHENTERMINE HCL 37.5 MG PO CAPS: 37.5000 mg | ORAL_CAPSULE | ORAL | 2 refills | Status: DC

## 2016-10-12 NOTE — Progress Notes (Signed)
Phen

## 2016-10-16 ENCOUNTER — Telehealth: Payer: Self-pay

## 2016-10-16 NOTE — Telephone Encounter (Signed)
PA for Sumatriptan sent to plan Key: [id number]

## 2016-10-22 NOTE — Telephone Encounter (Signed)
PA for sumatriptan has been denied. Any alternatives that can be sent?

## 2016-10-24 ENCOUNTER — Other Ambulatory Visit: Payer: Self-pay | Admitting: Family

## 2016-10-24 MED ORDER — RIZATRIPTAN BENZOATE 5 MG PO TABS: ORAL_TABLET | ORAL | 0 refills | Status: DC

## 2016-10-24 NOTE — Telephone Encounter (Signed)
We can try to send in Maxalt.

## 2016-11-12 ENCOUNTER — Other Ambulatory Visit: Payer: Self-pay | Admitting: Family

## 2016-12-07 ENCOUNTER — Encounter: Payer: Self-pay | Admitting: Family

## 2016-12-10 ENCOUNTER — Telehealth: Payer: Self-pay | Admitting: Family

## 2016-12-10 DIAGNOSIS — G43009 Migraine without aura, not intractable, without status migrainosus: Secondary | ICD-10-CM

## 2016-12-10 NOTE — Telephone Encounter (Signed)
Referral placed.

## 2016-12-10 NOTE — Telephone Encounter (Signed)
Pt called requesting a referral to Neurology for her migraines.

## 2016-12-11 ENCOUNTER — Encounter: Payer: Self-pay | Admitting: Neurology

## 2016-12-13 ENCOUNTER — Other Ambulatory Visit: Payer: Self-pay | Admitting: Family

## 2017-02-02 ENCOUNTER — Encounter: Payer: Self-pay | Admitting: Neurology

## 2017-02-02 ENCOUNTER — Ambulatory Visit (INDEPENDENT_AMBULATORY_CARE_PROVIDER_SITE_OTHER): Payer: BLUE CROSS/BLUE SHIELD | Admitting: Neurology

## 2017-02-02 VITALS — BP 104/68 | HR 58 | Ht 67.0 in | Wt 200.4 lb

## 2017-02-02 DIAGNOSIS — G43709 Chronic migraine without aura, not intractable, without status migrainosus: Secondary | ICD-10-CM | POA: Diagnosis not present

## 2017-02-02 DIAGNOSIS — R51 Headache with orthostatic component, not elsewhere classified: Secondary | ICD-10-CM

## 2017-02-02 DIAGNOSIS — G8929 Other chronic pain: Secondary | ICD-10-CM

## 2017-02-02 DIAGNOSIS — O26899 Other specified pregnancy related conditions, unspecified trimester: Secondary | ICD-10-CM | POA: Diagnosis not present

## 2017-02-02 DIAGNOSIS — G43701 Chronic migraine without aura, not intractable, with status migrainosus: Secondary | ICD-10-CM

## 2017-02-02 MED ORDER — ALMOTRIPTAN MALATE 12.5 MG PO TABS: 12.5000 mg | ORAL_TABLET | ORAL | 11 refills | Status: DC | PRN

## 2017-02-02 MED ORDER — TOPIRAMATE ER 50 MG PO SPRINKLE CAP24: 50.0000 mg | EXTENDED_RELEASE_CAPSULE | Freq: Every day | ORAL | 11 refills | Status: DC

## 2017-02-02 NOTE — Patient Instructions (Signed)
Take Qudexy  at bedtime. Email me in 3-4 weeks we may need to increase At onset of headache take Cambia and Almotriptan (will order cambia from specialty pharmacy they will call you) Stop OTC excedrin/alleve/ibuprofen  Almotriptan tablets What is this medicine? ALMOTRIPTAN (al moh TRIP tan) is used to treat migraines with or without aura. An aura is a strange feeling or visual disturbance that warns you of an attack. It is not used to prevent migraines. This medicine may be used for other purposes; ask your health care provider or pharmacist if you have questions. COMMON BRAND NAME(S): Axert What should I tell my health care provider before I take this medicine? They need to know if you have any of these conditions: -bowel disease or colitis -diabetes -family history of heart disease -fast or irregular heart beat -heart or blood vessel disease, angina (chest pain), or previous heart attack -high blood pressure -high cholesterol -history of stroke, transient ischemic attacks (TIAs or mini-strokes), or intracranial bleeding -kidney or liver disease -overweight -poor circulation -postmenopausal or surgical removal of uterus and ovaries -Raynaud's disease -seizure disorder -an unusual or allergic reaction to almotriptan, other medicines, foods, dyes, or preservatives -pregnant or trying to get pregnant -breast-feeding How should I use this medicine? Take this medicine by mouth with a glass of water. Follow the directions on the prescription label. This medicine is taken at the first symptoms of a migraine. It is not for everyday use. If your migraine headache returns after one dose, you can take another dose as directed. You must leave at least 2 hours between doses, and do not take more than 25 mg total in any 24 hour period. If there is no improvement at all after the first dose, do not take a second dose without talking to your doctor or health care professional. Do not take your  medicine more often than directed. Talk to your pediatrician regarding the use of this medicine in children. Special care may be needed. Overdosage: If you think you have taken too much of this medicine contact a poison control center or emergency room at once. NOTE: This medicine is only for you. Do not share this medicine with others. What if I miss a dose? This does not apply; this medicine is not for regular use. What may interact with this medicine? Do not take this medicine with any of the following medicines: -amphetamine or cocaine -dihydroergotamine, ergotamine, ergoloid mesylates, methysergide, or ergot-type medication - do not take within 24 hours of taking almotriptan -feverfew -MAOIs like Carbex, Eldepryl, Marplan, Nardil, and Parnate - do not take almotriptan within 2 weeks of stopping MAOI therapy -other migraine medicines like eletriptan, naratriptan, rizatriptan, zolmitriptan - do not take within 24 hours of taking almotriptan -tryptophan This medicine may also interact with the following medications: -erythromycin -lithium -medicines for fungal infections like ketoconazole and itraconazole -medicines for mental depression, anxiety or mood problems -medicines for weight loss such as dexfenfluramine, dextroamphetamine, fenfluramine, or sibutramine -ritonavir -St. John's wort This list may not describe all possible interactions. Give your health care provider a list of all the medicines, herbs, non-prescription drugs, or dietary supplements you use. Also tell them if you smoke, drink alcohol, or use illegal drugs. Some items may interact with your medicine. What should I watch for while using this medicine? Only take this medicine for a migraine headache. Take it if you get warning symptoms or at the start of a migraine attack. It is not for regular use to prevent migraine  attacks. You may get drowsy or dizzy. Do not drive, use machinery, or do anything that needs mental  alertness until you know how this medicine affects you. To reduce dizzy or fainting spells, do not sit or stand up quickly, especially if you are an older patient. Alcohol can increase drowsiness, dizziness and flushing. Avoid alcoholic drinks. Smoking cigarettes may increase the risk of heart-related side effects from using this medicine. If you take migraine medicines for 10 or more days a month, your migraines may get worse. Keep a diary of headache days and medicine use. Contact your healthcare professional if your migraine attacks occur more frequently. What side effects may I notice from receiving this medicine? Side effects that you should report to your doctor or health care professional as soon as possible: -allergic reactions like skin rash, itching or hives, swelling of the face, lips, or tongue -breathing problems -changes in vision -chest or throat pain, tightness -fast, slow, or irregular heart beat -high or low blood pressure -pain, tingling, numbness in the hands or feet -seizures -severe stomach pain and cramping, bloody diarrhea Side effects that usually do not require medical attention (report to your doctor or health care professional if they continue or are bothersome): -drowsiness -feeling warm, flushing, or redness of the face -headache -muscle pain or cramps -nausea, vomiting, diarrhea or stomach upset -weak or tired This list may not describe all possible side effects. Call your doctor for medical advice about side effects. You may report side effects to FDA at 1-800-FDA-1088. Where should I keep my medicine? Keep out of the reach of children. Store at room temperature between 15 and 30 degrees C (59 and 86 degrees F). Throw away any unused medicine after the expiration date. NOTE: This sheet is a summary. It may not cover all possible information. If you have questions about this medicine, talk to your doctor, pharmacist, or health care provider.  2018  Elsevier/Gold Standard (2012-12-21 17:00:04)   Topiramate extended-release capsules What is this medicine? TOPIRAMATE (toe PYRE a mate) is used to treat seizures in adults or children with epilepsy. It is also used for the prevention of migraine headaches. This medicine may be used for other purposes; ask your health care provider or pharmacist if you have questions. COMMON BRAND NAME(S): Trokendi XR What should I tell my health care provider before I take this medicine? They need to know if you have any of these conditions: -cirrhosis of the liver or liver disease -diarrhea -glaucoma -kidney stones or kidney disease -lung disease like asthma, obstructive pulmonary disease, emphysema -metabolic acidosis -on a ketogenic diet -scheduled for surgery or a procedure -suicidal thoughts, plans, or attempt; a previous suicide attempt by you or a family member -an unusual or allergic reaction to topiramate, other medicines, foods, dyes, or preservatives -pregnant or trying to get pregnant -breast-feeding How should I use this medicine? Take this medicine by mouth with a glass of water. Follow the directions on the prescription label. Trokendi XR capsules must be swallowed whole. Do not sprinkle on food, break, crush, dissolve, or chew. Qudexy XR capsules may be swallowed whole or opened and sprinkled on a small amount of soft food. This mixture must be swallowed immediately. Do not chew or store mixture for later use. You may take this medicine with meals. Take your medicine at regular intervals. Do not take it more often than directed. Talk to your pediatrician regarding the use of this medicine in children. Special care may be needed. While Trokendi XR  may be prescribed for children as young as 6 years and Qudexy XR may be prescribed for children as young as 2 years for selected conditions, precautions do apply. Overdosage: If you think you have taken too much of this medicine contact a poison  control center or emergency room at once. NOTE: This medicine is only for you. Do not share this medicine with others. What if I miss a dose? If you miss a dose, take it as soon as you can. If it is almost time for your next dose, take only that dose. Do not take double or extra doses. What may interact with this medicine? Do not take this medicine with any of the following medications: -probenecid This medicine may also interact with the following medications: -acetazolamide -alcohol -amitriptyline -birth control pills -digoxin -hydrochlorothiazide -lithium -medicines for pain, sleep, or muscle relaxation -metformin -methazolamide -other seizure or epilepsy medicines -pioglitazone -risperidone This list may not describe all possible interactions. Give your health care provider a list of all the medicines, herbs, non-prescription drugs, or dietary supplements you use. Also tell them if you smoke, drink alcohol, or use illegal drugs. Some items may interact with your medicine. What should I watch for while using this medicine? Visit your doctor or health care professional for regular checks on your progress. Do not stop taking this medicine suddenly. This increases the risk of seizures if you are using this medicine to control epilepsy. Wear a medical identification bracelet or chain to say you have epilepsy or seizures, and carry a card that lists all your medicines. This medicine can decrease sweating and increase your body temperature. Watch for signs of deceased sweating or fever, especially in children. Avoid extreme heat, hot baths, and saunas. Be careful about exercising, especially in hot weather. Contact your health care provider right away if you notice a fever or decrease in sweating. You should drink plenty of fluids while taking this medicine. If you have had kidney stones in the past, this will help to reduce your chances of forming kidney stones. If you have stomach pain, with  nausea or vomiting and yellowing of your eyes or skin, call your doctor immediately. You may get drowsy, dizzy, or have blurred vision. Do not drive, use machinery, or do anything that needs mental alertness until you know how this medicine affects you. To reduce dizziness, do not sit or stand up quickly, especially if you are an older patient. Alcohol can increase drowsiness and dizziness. Avoid alcoholic drinks. Do not drink alcohol for 6 hours before or 6 hours after taking Trokendi XR. If you notice blurred vision, eye pain, or other eye problems, seek medical attention at once for an eye exam. The use of this medicine may increase the chance of suicidal thoughts or actions. Pay special attention to how you are responding while on this medicine. Any worsening of mood, or thoughts of suicide or dying should be reported to your health care professional right away. This medicine may increase the chance of developing metabolic acidosis. If left untreated, this can cause kidney stones, bone disease, or slowed growth in children. Symptoms include breathing fast, fatigue, loss of appetite, irregular heartbeat, or loss of consciousness. Call your doctor immediately if you experience any of these side effects. Also, tell your doctor about any surgery you plan on having while taking this medicine since this may increase your risk for metabolic acidosis. Birth control pills may not work properly while you are taking this medicine. Talk to your doctor  about using an extra method of birth control. Women who become pregnant while using this medicine may enroll in the [place] American Antiepileptic Drug Pregnancy Registry by calling [phone]. This registry collects information about the safety of antiepileptic drug use during pregnancy. What side effects may I notice from receiving this medicine? Side effects that you should report to your doctor or health care professional as soon as possible: -allergic reactions  like skin rash, itching or hives, swelling of the face, lips, or tongue -decreased sweating and/or rise in body temperature -depression -difficulty breathing, fast or irregular breathing patterns -difficulty speaking -difficulty walking or controlling muscle movements -hearing impairment -redness, blistering, peeling or loosening of the skin, including inside the mouth -tingling, pain or numbness in the hands or feet -unusually weak or tired -worsening of mood, thoughts or actions of suicide or dying Side effects that usually do not require medical attention (report to your doctor or health care professional if they continue or are bothersome): -altered taste -back pain, joint or muscle aches and pains -diarrhea, or constipation -headache -loss of appetite -nausea -stomach upset, indigestion -tremors This list may not describe all possible side effects. Call your doctor for medical advice about side effects. You may report side effects to FDA at 1-800-FDA-1088. Where should I keep my medicine? Keep out of the reach of children. Store at room temperature between 15 and 30 degrees C (59 and 86 degrees F) in a tightly closed container. Protect from moisture. Throw away any unused medicine after the expiration date. NOTE: This sheet is a summary. It may not cover all possible information. If you have questions about this medicine, talk to your doctor, pharmacist, or health care provider.  2018 Elsevier/Gold Standard (2015-08-16 12:33:11)

## 2017-02-02 NOTE — Progress Notes (Signed)
[facility]    Provider:  Dr Lucia Gaskins Referring Provider: Veryl Speak, FNP Primary Care Physician:  Veryl Speak, FNP  CC:  Migraines  HPI:  Lynn Mendez is a 26 y.o. female here as a referral from Dr. Carver Fila for migraines. Diagnosed at the age of 1, big family history of migraines. Started taking medication at the age of 23.  She has migraines Storms make it worse. Sleeping help. Heat triggers.  Sleep deficits, chocolate, not hydrating well throughout the day also is a trigger. Not really hormonal. They can last all day. Imitrex and excedrin used to help acutely but doesn't anymore, no medication overuse. Rizatriptan/Maxalt once in the past with side effects, felt very bad, worsened migraine, made her sick. Then went to imitrex and worked for a week. In the last 3 months the migraines are worsening. 15 headache days a month, all of them are migrainous, they can be severe, can last 24 hours with nausea, rarely vomiting, has phonophobia,photophobia,soundsensitivity, Behind the eys, throbbing or pulsating. This frequency and severity has been ongoing for at least 6 months.  No aura. She wakes with migraines recently woke up with one. Worsening since having the baby.  No other focal neurologic deficits, associated symptoms, inciting events or modifiable factors.  Meds tried: zofran, maxalt, imitrex,   Reviewed notes, labs and imaging from outside physicians, which showed: CBC and CMP unremarkable  Review of Systems: Patient complains of symptoms per HPI as well as the following symptoms: headache. Pertinent negatives and positives per HPI. All others negative.   Social History   Social History  . Marital status: Married    Spouse name: N/A  . Number of children: 1  . Years of education: 103   Occupational History  . Front Architect    Social History Main Topics  . Smoking status: Never Smoker  . Smokeless tobacco: Never Used  . Alcohol use No  .  Drug use: No  . Sexual activity: Yes   Other Topics Concern  . Not on file   Social History Narrative   Fun/Hobby: Reading, sewing   Denies abuse and feels safe at home     Family History  Problem Relation Age of Onset  . Hypertension Mother   . Diabetes Mother   . Cancer Paternal Grandmother   . Lung cancer Paternal Grandmother   . Skin cancer Paternal Grandmother   . Heart attack Paternal Grandfather     Past Medical History:  Diagnosis Date  . Allergy   . Chicken pox   . Kidney stones   . Migraines   . Pancreatitis   . PONV (postoperative nausea and vomiting)     Past Surgical History:  Procedure Laterality Date  . CHOLECYSTECTOMY      Current Outpatient Prescriptions  Medication Sig Dispense Refill  . aspirin-acetaminophen-caffeine (EXCEDRIN MIGRAINE) 250-250-65 MG tablet Take by mouth every 6 (six) hours as needed for headache.    . ibuprofen (ADVIL,MOTRIN) 600 MG tablet Take 1 tablet (600 mg total) by mouth every 6 (six) hours. 30 tablet 1  . levonorgestrel-ethinyl estradiol (LARISSIA) 0.1-20 MG-MCG tablet Take 1 tablet by mouth daily.    Marland Kitchen almotriptan (AXERT) 12.5 MG tablet Take 1 tablet (12.5 mg total) by mouth as needed for migraine. may repeat in 2 hours if needed 10 tablet 11  . Diclofenac Potassium 50 MG PACK Take 50 mg by mouth once as needed. Take once daily as needed with headache onset. Please take with food  9 each 11  . Topiramate ER (QUDEXY XR) 50 MG CS24 sprinkle capsule Take 50 mg by mouth at bedtime. 30 each 11   No current facility-administered medications for this visit.     Allergies as of 02/02/2017 - Review Complete 02/02/2017  Allergen Reaction Noted  . Maxalt [rizatriptan benzoate] Other (See Comments) 02/02/2017    Vitals: BP 104/68   Pulse (!) 58   Ht  (1.702 m)   Wt 200 lb 6.4 oz (90.9 kg)   BMI 31.39 kg/m  Last Weight:  Wt Readings from Last 1 Encounters:  02/02/17 200 lb 6.4 oz (90.9 kg)   Last Height:   Ht  Readings from Last 1 Encounters:  02/02/17  (1.702 m)    Physical exam: Exam: Gen: NAD, conversant, well nourised, obese, well groomed                     CV: RRR, no MRG. No Carotid Bruits. No peripheral edema, warm, nontender Eyes: Conjunctivae clear without exudates or hemorrhage  Neuro: Detailed Neurologic Exam  Speech:    Speech is normal; fluent and spontaneous with normal comprehension.  Cognition:    The patient is oriented to person, place, and time;     recent and remote memory intact;     language fluent;     normal attention, concentration,     fund of knowledge Cranial Nerves:    The pupils are equal, round, and reactive to light. The fundi are normal and spontaneous venous pulsations are present. Visual fields are full to finger confrontation. Extraocular movements are intact. Trigeminal sensation is intact and the muscles of mastication are normal. The face is symmetric. The palate elevates in the midline. Hearing intact. Voice is normal. Shoulder shrug is normal. The tongue has normal motion without fasciculations.   Coordination:    Normal finger to nose and heel to shin. Normal rapid alternating movements.   Gait:    Heel-toe and tandem gait are normal.   Motor Observation:    No asymmetry, no atrophy, and no involuntary movements noted. Tone:    Normal muscle tone.    Posture:    Posture is normal. normal erect    Strength:    Strength is V/V in the upper and lower limbs.      Sensation: intact to LT     Reflex Exam:  DTR's:    Deep tendon reflexes in the upper and lower extremities are normal bilaterally.   Toes:    The toes are downgoing bilaterally.   Clonus:    Clonus is absent.      Assessment/Plan:  26 year old patient with chronic migraines who is here with worsening symptoms since having her baby including positional quality, increased frequency and severity, stable quality. Given worsening headaches with a positional quality need  MRI of the brain with and without contrast to evaluate for causes of headache especially after pregnancy such as hemorrhage, cerebral thrombosis (patient declines MRV will order an MRI brain only) or other causes.  MRI brain w/wo contrast Take Qudexy  at bedtime. Email me in 3-4 weeks we may need to increase At onset of headache take Cambia and Almotriptan (will order cambia from specialty pharmacy they will call you) Stop OTC excedrin/alleve/ibuprofen Patient had MRIs of the brain and was treated for headache many years ago, we'll try to see if we can find her previous records however unlikely  Orders Placed This Encounter  Procedures  . MR BRAIN  W WO CONTRAST     Naomie Dean, MD  [facility] [address] [place], [place] [address]  Phone [phone] Fax [phone]

## 2017-02-03 DIAGNOSIS — G43701 Chronic migraine without aura, not intractable, with status migrainosus: Secondary | ICD-10-CM | POA: Insufficient documentation

## 2017-02-03 MED ORDER — DICLOFENAC POTASSIUM(MIGRAINE) 50 MG PO PACK: 50.0000 mg | PACK | Freq: Once | ORAL | 11 refills | Status: DC | PRN

## 2017-02-05 ENCOUNTER — Encounter: Payer: Self-pay | Admitting: Neurology

## 2017-02-08 ENCOUNTER — Telehealth: Payer: Self-pay | Admitting: Neurology

## 2017-02-08 NOTE — Telephone Encounter (Signed)
Cheryl with [facility] is calling to get PA for Diclofenac Potassium 50 MG PACK.

## 2017-02-08 NOTE — Telephone Encounter (Signed)
Called cheryl back and she was not at her desk. LVM for her to call back

## 2017-02-08 NOTE — Telephone Encounter (Signed)
Lynn Mendez called back and I was able to get her the information she needed for the PA to be completed on Diclofenac Potassium

## 2017-02-09 ENCOUNTER — Telehealth: Payer: Self-pay | Admitting: *Deleted

## 2017-02-09 NOTE — Telephone Encounter (Signed)
Received fax from [facility] pharmacy requesting additional information to do PA on Cambia. Documents faxed.

## 2017-02-12 ENCOUNTER — Telehealth: Payer: Self-pay

## 2017-02-12 NOTE — Telephone Encounter (Signed)
FMLA forms received for this patient. They have been completed and placed in Dr. Trevor Mace basket to sign.

## 2017-02-16 ENCOUNTER — Telehealth: Payer: Self-pay

## 2017-02-16 NOTE — Telephone Encounter (Signed)
LVM requesting call back re: Cambia and if she has heard from pharmacy or has received Cambia.

## 2017-02-16 NOTE — Telephone Encounter (Signed)
We received a fax from [facility] stating that the Qudexy is not covered, but topiramate  tablets are covered. Do you want to switch or would you like for me to try for a PA?

## 2017-02-18 MED ORDER — TOPIRAMATE 50 MG PO TABS: 50.0000 mg | ORAL_TABLET | Freq: Every day | ORAL | 11 refills | Status: DC

## 2017-02-18 NOTE — Telephone Encounter (Signed)
Yes please switch to generic, she has never tried that thanks

## 2017-02-18 NOTE — Telephone Encounter (Signed)
Prescription changed to Topiramate 50 mg PO daily HS and sent to her pharmacy. I called her to let her know. She stated that she has the Qudexy card with free supply for a year. She reports she has filled it and is taking it with significant improvement in symptoms, only reporting 1 migraine and 1 headache since she started the medication whereas prior to medications she had a migraine for 9 days straight. I told her that if she has any other issues getting the Qudexy filled, she has the Topiramate generic prescription available, but that she is to only take one of those at night. She stated that her pharmacist mentioned Qudexy might affect her birth control and she reported that she had already d/w Dr. Lucia Gaskins via email. She will consult MD or Pharmacy for any further questions she may have.

## 2017-02-22 ENCOUNTER — Other Ambulatory Visit: Payer: BLUE CROSS/BLUE SHIELD

## 2017-02-26 ENCOUNTER — Ambulatory Visit
Admission: RE | Admit: 2017-02-26 | Discharge: 2017-02-26 | Disposition: A | Discharge status: Home / Self Care | Payer: BLUE CROSS/BLUE SHIELD | Source: Ambulatory Visit | Attending: Neurology | Admitting: Neurology

## 2017-02-26 DIAGNOSIS — R51 Headache with orthostatic component, not elsewhere classified: Secondary | ICD-10-CM

## 2017-02-26 DIAGNOSIS — G8929 Other chronic pain: Secondary | ICD-10-CM

## 2017-02-26 DIAGNOSIS — O26899 Other specified pregnancy related conditions, unspecified trimester: Secondary | ICD-10-CM

## 2017-02-26 MED ORDER — GADOBENATE DIMEGLUMINE 529 MG/ML IV SOLN
20.0000 mL | Freq: Once | INTRAVENOUS | Status: AC | PRN
  Administered 2017-02-26: 20 mL via INTRAVENOUS

## 2017-02-28 ENCOUNTER — Encounter: Payer: Self-pay | Admitting: Neurology

## 2017-03-01 ENCOUNTER — Telehealth: Payer: Self-pay | Admitting: *Deleted

## 2017-03-01 NOTE — Telephone Encounter (Signed)
-----   Message from Anson FretAntonia B Ahern, MD sent at 02/28/2017  9:13 PM EDT ----- MRI of the brain unremarkable thanks

## 2017-03-01 NOTE — Telephone Encounter (Signed)
Called patient, she verbalized understanding that her MRI was unremarkable. She had no further questions.

## 2017-03-04 NOTE — Telephone Encounter (Signed)
[employer] FMLA paperwork completed, signed by Dr. Lucia GaskinsAhern and sent to medical records for processing.

## 2017-03-17 ENCOUNTER — Ambulatory Visit: Payer: BLUE CROSS/BLUE SHIELD | Admitting: Neurology

## 2017-04-05 MED ORDER — TOPIRAMATE ER 50 MG PO SPRINKLE CAP24: 50.0000 mg | EXTENDED_RELEASE_CAPSULE | Freq: Every day | ORAL | 3 refills | Status: DC

## 2017-04-05 NOTE — Addendum Note (Signed)
Addended by: Bertram SavinULBERTSON, BETHANY L on: 04/05/2017 02:24 PM   Modules accepted: Orders

## 2017-04-05 NOTE — Telephone Encounter (Signed)
Received notification from [facility] stating insurance requires 90 day supply. Called pharmacy to make sure pt has been filling the Qudexy and they stated she has been filling it. Qudexy XR 50 mg reordered, 90 day supply.

## 2017-04-05 NOTE — Telephone Encounter (Signed)
Faxed signed Qudexy prescription to [facility] pharmacy. Received a receipt of confirmation.

## 2017-05-08 ENCOUNTER — Encounter: Payer: Self-pay | Admitting: Neurology

## 2017-05-13 ENCOUNTER — Other Ambulatory Visit: Payer: Self-pay | Admitting: Neurology

## 2017-05-13 MED ORDER — DICLOFENAC SODIUM 50 MG PO TBEC: 50.0000 mg | DELAYED_RELEASE_TABLET | Freq: Three times a day (TID) | ORAL | 5 refills | Status: DC | PRN

## 2017-12-14 LAB — OB RESULTS CONSOLE RPR: RPR: NONREACTIVE

## 2017-12-14 LAB — OB RESULTS CONSOLE GC/CHLAMYDIA
Chlamydia: NEGATIVE
Gonorrhea: NEGATIVE

## 2017-12-14 LAB — OB RESULTS CONSOLE HEPATITIS B SURFACE ANTIGEN: Hepatitis B Surface Ag: NEGATIVE

## 2017-12-14 LAB — OB RESULTS CONSOLE RUBELLA ANTIBODY, IGM: Rubella: IMMUNE

## 2017-12-14 LAB — OB RESULTS CONSOLE ANTIBODY SCREEN: Antibody Screen: NEGATIVE

## 2017-12-14 LAB — OB RESULTS CONSOLE ABO/RH: RH Type: POSITIVE

## 2017-12-14 LAB — OB RESULTS CONSOLE HIV ANTIBODY (ROUTINE TESTING): HIV: NONREACTIVE

## 2018-05-11 NOTE — L&D Delivery Note (Signed)
Delivery Note At 8:21 PM a viable female was delivered via Vaginal, Spontaneous (Presentation: LOA).  APGAR: 9, 9; weight  pending.   Placenta status: S, I. 3V Cord with the following complications: none.  Cord pH: n/a  Anesthesia:  CLEA Episiotomy: Median Lacerations: 2nd degree Suture Repair: 3.0 vicryl rapide Est. Blood Loss (mL):  200  Mom to postpartum.  Baby to Couplet care / Skin to Skin.  Mitchel Honour 07/21/2018, 9:02 PM

## 2018-07-06 NOTE — Progress Notes (Addendum)
Pt presents for ECV  US shows fetus transverse back down with head on maternal right at last OB appt.  Pt offered c-section vs ECV.  Elects for ECV.  GBS neg.  R/b/a discussed, questions answered.     NST

## 2018-07-07 ENCOUNTER — Ambulatory Visit (HOSPITAL_COMMUNITY)
Admission: RE | Admit: 2018-07-07 | Discharge: 2018-07-07 | Disposition: A | Discharge status: Home / Self Care | Payer: BLUE CROSS/BLUE SHIELD | Source: Ambulatory Visit | Attending: Obstetrics and Gynecology | Admitting: Obstetrics and Gynecology

## 2018-07-07 ENCOUNTER — Inpatient Hospital Stay (HOSPITAL_COMMUNITY): Admit: 2018-07-07 | Payer: Self-pay | Admitting: Obstetrics and Gynecology

## 2018-07-07 ENCOUNTER — Ambulatory Visit (HOSPITAL_COMMUNITY)
Admission: RE | Admit: 2018-07-07 | Discharge: 2018-07-07 | Disposition: A | Discharge status: Home / Self Care | Payer: BLUE CROSS/BLUE SHIELD | Attending: Obstetrics and Gynecology | Admitting: Obstetrics and Gynecology

## 2018-07-07 DIAGNOSIS — Z3A00 Weeks of gestation of pregnancy not specified: Secondary | ICD-10-CM | POA: Insufficient documentation

## 2018-07-07 DIAGNOSIS — O322XX0 Maternal care for transverse and oblique lie, not applicable or unspecified: Secondary | ICD-10-CM | POA: Diagnosis not present

## 2018-07-07 MED ORDER — TERBUTALINE SULFATE 1 MG/ML IJ SOLN
0.2500 mg | Freq: Once | INTRAMUSCULAR | Status: AC
  Administered 2018-07-07: 0.25 mg via SUBCUTANEOUS
  Filled 2018-07-07 (×3): qty 1

## 2018-07-07 NOTE — Progress Notes (Signed)
Pt elects ECV.  R/b/a discussed, questions answered, informed consent NST reactive Terbutaline given US done and fetus oblique with head on maternal right. Pressure applied to abdomen pushing fetal head toward pelvis  Intermittent US done to confirm FHT reassuring Fetus found vertex with last [place] NST prior to discharge

## 2018-07-07 NOTE — OB Triage Note (Signed)
Pt successful version. Vital signs stable fhr 140 with good variabliity and no decels pt discharged to home with husband

## 2018-07-07 NOTE — Discharge Instructions (Signed)

## 2018-07-18 ENCOUNTER — Encounter (HOSPITAL_COMMUNITY): Payer: Self-pay | Admitting: *Deleted

## 2018-07-18 ENCOUNTER — Telehealth (HOSPITAL_COMMUNITY): Payer: Self-pay | Admitting: *Deleted

## 2018-07-18 LAB — OB RESULTS CONSOLE GBS: STREP GROUP B AG: NEGATIVE

## 2018-07-18 NOTE — Telephone Encounter (Signed)
Preadmission screen  

## 2018-07-20 ENCOUNTER — Other Ambulatory Visit (HOSPITAL_COMMUNITY): Payer: Self-pay | Admitting: *Deleted

## 2018-07-21 ENCOUNTER — Encounter (HOSPITAL_COMMUNITY): Payer: Self-pay

## 2018-07-21 ENCOUNTER — Inpatient Hospital Stay (HOSPITAL_COMMUNITY): Payer: BLUE CROSS/BLUE SHIELD

## 2018-07-21 ENCOUNTER — Other Ambulatory Visit: Payer: Self-pay

## 2018-07-21 ENCOUNTER — Inpatient Hospital Stay (HOSPITAL_COMMUNITY)
Admission: RE | Admit: 2018-07-21 | Discharge: 2018-07-22 | DRG: 807 | Disposition: A | Discharge status: Home / Self Care | Payer: BLUE CROSS/BLUE SHIELD | Attending: Obstetrics & Gynecology | Admitting: Obstetrics & Gynecology

## 2018-07-21 ENCOUNTER — Inpatient Hospital Stay (HOSPITAL_COMMUNITY): Payer: BLUE CROSS/BLUE SHIELD | Admitting: Anesthesiology

## 2018-07-21 DIAGNOSIS — Z3A39 39 weeks gestation of pregnancy: Secondary | ICD-10-CM

## 2018-07-21 DIAGNOSIS — O99214 Obesity complicating childbirth: Secondary | ICD-10-CM | POA: Diagnosis present

## 2018-07-21 DIAGNOSIS — Z3490 Encounter for supervision of normal pregnancy, unspecified, unspecified trimester: Secondary | ICD-10-CM

## 2018-07-21 DIAGNOSIS — E669 Obesity, unspecified: Secondary | ICD-10-CM | POA: Diagnosis present

## 2018-07-21 DIAGNOSIS — O26893 Other specified pregnancy related conditions, third trimester: Secondary | ICD-10-CM | POA: Diagnosis present

## 2018-07-21 LAB — CBC
HCT: 34.4 % — ABNORMAL LOW (ref 36.0–46.0)
Hemoglobin: 11 g/dL — ABNORMAL LOW (ref 12.0–15.0)
MCH: 26.4 pg (ref 26.0–34.0)
MCHC: 32 g/dL (ref 30.0–36.0)
MCV: 82.5 fL (ref 80.0–100.0)
Platelets: 223 10*3/uL (ref 150–400)
RBC: 4.17 MIL/uL (ref 3.87–5.11)
RDW: 14.7 % (ref 11.5–15.5)
WBC: 8.9 10*3/uL (ref 4.0–10.5)
nRBC: 0 % (ref 0.0–0.2)

## 2018-07-21 LAB — TYPE AND SCREEN
ABO/RH(D): O POS
Antibody Screen: NEGATIVE

## 2018-07-21 LAB — ABO/RH: ABO/RH(D): O POS

## 2018-07-21 LAB — RPR: RPR Ser Ql: NONREACTIVE

## 2018-07-21 MED ORDER — SIMETHICONE 80 MG PO CHEW: 80.0000 mg | CHEWABLE_TABLET | ORAL | Status: DC | PRN

## 2018-07-21 MED ORDER — ONDANSETRON HCL 4 MG/2ML IJ SOLN: 4.0000 mg | INTRAMUSCULAR | Status: DC | PRN

## 2018-07-21 MED ORDER — LACTATED RINGERS IV SOLN: 500.0000 mL | INTRAVENOUS | Status: DC | PRN

## 2018-07-21 MED ORDER — DIPHENHYDRAMINE HCL 50 MG/ML IJ SOLN: 12.5000 mg | INTRAMUSCULAR | Status: DC | PRN

## 2018-07-21 MED ORDER — FLEET ENEMA 7-19 GM/118ML RE ENEM: 1.0000 | ENEMA | RECTAL | Status: DC | PRN

## 2018-07-21 MED ORDER — DIBUCAINE 1 % RE OINT: 1.0000 "application " | TOPICAL_OINTMENT | RECTAL | Status: DC | PRN

## 2018-07-21 MED ORDER — PHENYLEPHRINE 40 MCG/ML (10ML) SYRINGE FOR IV PUSH (FOR BLOOD PRESSURE SUPPORT): 80.0000 ug | PREFILLED_SYRINGE | INTRAVENOUS | Status: DC | PRN

## 2018-07-21 MED ORDER — LIDOCAINE HCL (PF) 1 % IJ SOLN: 30.0000 mL | INTRAMUSCULAR | Status: DC | PRN

## 2018-07-21 MED ORDER — TERBUTALINE SULFATE 1 MG/ML IJ SOLN: 0.2500 mg | Freq: Once | INTRAMUSCULAR | Status: DC | PRN

## 2018-07-21 MED ORDER — OXYTOCIN BOLUS FROM INFUSION
500.0000 mL | Freq: Once | INTRAVENOUS | Status: AC
  Administered 2018-07-21: 500 mL via INTRAVENOUS

## 2018-07-21 MED ORDER — LACTATED RINGERS IV SOLN
INTRAVENOUS | Status: DC
  Administered 2018-07-21 (×2): via INTRAVENOUS

## 2018-07-21 MED ORDER — SENNOSIDES-DOCUSATE SODIUM 8.6-50 MG PO TABS
2.0000 | ORAL_TABLET | ORAL | Status: DC
  Administered 2018-07-21: 2 via ORAL
  Filled 2018-07-21: qty 2

## 2018-07-21 MED ORDER — BENZOCAINE-MENTHOL 20-0.5 % EX AERO
1.0000 "application " | INHALATION_SPRAY | CUTANEOUS | Status: DC | PRN
  Administered 2018-07-21: 1 via TOPICAL
  Filled 2018-07-21: qty 56

## 2018-07-21 MED ORDER — ACETAMINOPHEN 325 MG PO TABS: 650.0000 mg | ORAL_TABLET | ORAL | Status: DC | PRN

## 2018-07-21 MED ORDER — OXYTOCIN 40 UNITS IN NORMAL SALINE INFUSION - SIMPLE MED
1.0000 m[IU]/min | INTRAVENOUS | Status: DC
  Administered 2018-07-21: 2 m[IU]/min via INTRAVENOUS
  Filled 2018-07-21: qty 1000

## 2018-07-21 MED ORDER — OXYCODONE-ACETAMINOPHEN 5-325 MG PO TABS: 2.0000 | ORAL_TABLET | ORAL | Status: DC | PRN

## 2018-07-21 MED ORDER — EPHEDRINE 5 MG/ML INJ: 10.0000 mg | INTRAVENOUS | Status: DC | PRN

## 2018-07-21 MED ORDER — OXYCODONE-ACETAMINOPHEN 5-325 MG PO TABS
1.0000 | ORAL_TABLET | ORAL | Status: DC | PRN
  Administered 2018-07-22: 1 via ORAL
  Filled 2018-07-21: qty 1

## 2018-07-21 MED ORDER — WITCH HAZEL-GLYCERIN EX PADS: 1.0000 "application " | MEDICATED_PAD | CUTANEOUS | Status: DC | PRN

## 2018-07-21 MED ORDER — PRENATAL MULTIVITAMIN CH
1.0000 | ORAL_TABLET | Freq: Every day | ORAL | Status: DC
  Administered 2018-07-22: 1 via ORAL
  Filled 2018-07-21: qty 1

## 2018-07-21 MED ORDER — SOD CITRATE-CITRIC ACID 500-334 MG/5ML PO SOLN: 30.0000 mL | ORAL | Status: DC | PRN

## 2018-07-21 MED ORDER — FENTANYL CITRATE (PF) 100 MCG/2ML IJ SOLN: 50.0000 ug | INTRAMUSCULAR | Status: DC | PRN

## 2018-07-21 MED ORDER — TETANUS-DIPHTH-ACELL PERTUSSIS 5-2.5-18.5 LF-MCG/0.5 IM SUSP: 0.5000 mL | Freq: Once | INTRAMUSCULAR | Status: DC

## 2018-07-21 MED ORDER — OXYTOCIN 40 UNITS IN NORMAL SALINE INFUSION - SIMPLE MED: 2.5000 [IU]/h | INTRAVENOUS | Status: DC

## 2018-07-21 MED ORDER — SODIUM CHLORIDE (PF) 0.9 % IJ SOLN
INTRAMUSCULAR | Status: DC | PRN
  Administered 2018-07-21: 12 mL/h via EPIDURAL

## 2018-07-21 MED ORDER — FENTANYL-BUPIVACAINE-NACL 0.5-0.125-0.9 MG/250ML-% EP SOLN
EPIDURAL | Status: AC
  Filled 2018-07-21: qty 250

## 2018-07-21 MED ORDER — ONDANSETRON HCL 4 MG/2ML IJ SOLN: 4.0000 mg | Freq: Four times a day (QID) | INTRAMUSCULAR | Status: DC | PRN

## 2018-07-21 MED ORDER — ONDANSETRON HCL 4 MG PO TABS: 4.0000 mg | ORAL_TABLET | ORAL | Status: DC | PRN

## 2018-07-21 MED ORDER — COCONUT OIL OIL: 1.0000 "application " | TOPICAL_OIL | Status: DC | PRN

## 2018-07-21 MED ORDER — IBUPROFEN 600 MG PO TABS
600.0000 mg | ORAL_TABLET | Freq: Four times a day (QID) | ORAL | Status: DC
  Administered 2018-07-21 – 2018-07-22 (×4): 600 mg via ORAL
  Filled 2018-07-21 (×4): qty 1

## 2018-07-21 MED ORDER — FENTANYL-BUPIVACAINE-NACL 0.5-0.125-0.9 MG/250ML-% EP SOLN: 12.0000 mL/h | EPIDURAL | Status: DC | PRN

## 2018-07-21 MED ORDER — DIPHENHYDRAMINE HCL 25 MG PO CAPS: 25.0000 mg | ORAL_CAPSULE | Freq: Four times a day (QID) | ORAL | Status: DC | PRN

## 2018-07-21 MED ORDER — LACTATED RINGERS IV SOLN: 500.0000 mL | Freq: Once | INTRAVENOUS | Status: DC

## 2018-07-21 MED ORDER — ZOLPIDEM TARTRATE 5 MG PO TABS: 5.0000 mg | ORAL_TABLET | Freq: Every evening | ORAL | Status: DC | PRN

## 2018-07-21 MED ORDER — LIDOCAINE HCL (PF) 1 % IJ SOLN
INTRAMUSCULAR | Status: DC | PRN
  Administered 2018-07-21 (×2): 6 mL via EPIDURAL

## 2018-07-21 NOTE — Anesthesia Pain Management Evaluation Note (Signed)
  CRNA Pain Management Visit Note  Patient: Lynn Mendez, 27 y.o., female  "Hello I am a member of the anesthesia team at [facility]. We have an anesthesia team available at all times to provide care throughout the hospital, including epidural management and anesthesia for C-section. I don't know your plan for the delivery whether it a natural birth, water birth, IV sedation, nitrous supplementation, doula or epidural, but we want to meet your pain goals."   1.Was your pain managed to your expectations on prior hospitalizations?   Yes   2.What is your expectation for pain management during this hospitalization?     Epidural  3.How can we help you reach that goal?   Record the patient's initial score and the patient's pain goal.   Pain: 4  Pain Goal: 2 [facility] wants you to be able to say your pain was always managed very well.  Laban Emperor 07/21/2018

## 2018-07-21 NOTE — Progress Notes (Signed)
Lynn Mendez is a 28 y.o. G2P1001 at [redacted]w[redacted]d by ultrasound admitted for induction of labor due to Elective at term.  Subjective: No c/o.  Comfortable with CLEA.  Objective: BP 111/80   Pulse 70   Temp 97.8 F (36.6 C) (Oral)   Resp 18   Ht 5\' 7"  (1.702 m)   Wt 108.9 kg   LMP 10/17/2017   SpO2 99%   BMI 37.59 kg/m  No intake/output data recorded. No intake/output data recorded.  FHT:  FHR: 130 bpm, variability: moderate,  accelerations:  Present,  decelerations:  Absent UC:   regular, every 2-3 minutes SVE:   Dilation: 1.5 Effacement (%): 50 Station: -3 Exam by:: Dr. Langston Masker CVX 3/50/-2, AROM, clear  Labs: Lab Results  Component Value Date   WBC 8.9 07/21/2018   HGB 11.0 (L) 07/21/2018   HCT 34.4 (L) 07/21/2018   MCV 82.5 07/21/2018   PLT 223 07/21/2018    Assessment / Plan: Induction of labor due to elective,  progressing well on pitocin  Labor: Progressing normally Preeclampsia:  n/a Fetal Wellbeing:  Category I Pain Control:  Epidural I/D:  n/a Anticipated MOD:  NSVD  Mitchel Honour 07/21/2018, 2:31 PM

## 2018-07-21 NOTE — Progress Notes (Signed)
Dyana Carico is a 28 y.o. G2P1001 at [redacted]w[redacted]d by ultrasound admitted for induction of labor due to Elective at term.  Subjective: Comfortable with CLEA  Objective: BP 124/79   Pulse 76   Temp 98.1 F (36.7 C) (Oral)   Resp 18   Ht 5\' 7"  (1.702 m)   Wt 108.9 kg   LMP 10/17/2017   SpO2 99%   BMI 37.59 kg/m  I/O last 3 completed shifts: In: -  Out: 1000 [Urine:1000] No intake/output data recorded.  FHT:  FHR: 125 bpm, variability: moderate,  accelerations:  Present,  decelerations:  Absent UC:   regular, every 2-4 minutes SVE:   Dilation: 4.5 Effacement (%): 80 Station: -2 Exam by:: Henderson Newcomer RN CVX 8/c/-1  Labs: Lab Results  Component Value Date   WBC 8.9 07/21/2018   HGB 11.0 (L) 07/21/2018   HCT 34.4 (L) 07/21/2018   MCV 82.5 07/21/2018   PLT 223 07/21/2018    Assessment / Plan: Induction of labor due to elective,  progressing well on pitocin  Labor: Progressing normally Preeclampsia:  n/a Fetal Wellbeing:  Category I Pain Control:  Epidural I/D:  n/a Anticipated MOD:  NSVD  Mitchel Honour 07/21/2018, 7:09 PM

## 2018-07-21 NOTE — H&P (Signed)
Lynn Mendez is a 28 y.o. female presenting for elective IOL.  Patient was breech and underwent successful ECV.  No antepartum complications.  GBS negative.  OB History    Gravida  2   Para  1   Term  1   Preterm      AB      Living  1     SAB      TAB      Ectopic      Multiple  0   Live Births  1          Past Medical History:  Diagnosis Date  . Allergy   . Chicken pox   . Kidney stones   . Migraines   . Pancreatitis   . PONV (postoperative nausea and vomiting)    Past Surgical History:  Procedure Laterality Date  . CHOLECYSTECTOMY    . ERCP     Family History: family history includes Breast cancer in her paternal uncle; Cancer in her paternal grandmother; Diabetes in her mother; Heart attack in her paternal grandfather; Hypertension in her mother; Lung cancer in her paternal aunt and paternal grandmother; Multiple sclerosis in her maternal aunt; Other in her sister; Skin cancer in her paternal grandmother; Spina bifida in her brother; Uterine cancer in her paternal aunt. Social History:  reports that she has never smoked. She has never used smokeless tobacco. She reports that she does not drink alcohol or use drugs.     Maternal Diabetes: No Genetic Screening: Normal Maternal Ultrasounds/Referrals: Normal Fetal Ultrasounds or other Referrals:  None Maternal Substance Abuse:  No Significant Maternal Medications:  None Significant Maternal Lab Results:  Lab values include: Group B Strep negative Other Comments:  None  ROS Maternal Medical History:  Fetal activity: Perceived fetal activity is normal.   Last perceived fetal movement was within the past hour.    Prenatal complications: no prenatal complications Prenatal Complications - Diabetes: none.    Dilation: 1.5 Effacement (%): 50 Station: -3 Exam by:: Dr. Langston Masker Blood pressure 120/81, pulse 76, temperature 98.4 F (36.9 C), temperature source Oral, resp. rate 18, height 5\' 7"  (1.702 m),  weight 108.9 kg, last menstrual period 10/17/2017, unknown if currently breastfeeding. Maternal Exam:  Uterine Assessment: Contraction strength is mild.  Contraction frequency is rare.   Abdomen: Patient reports no abdominal tenderness. Fundal height is c/w dates.   Estimated fetal weight is 8#.   Fetal presentation: vertex  Introitus: Normal vulva. Pelvis: adequate for delivery.   Cervix: Cervix evaluated by digital exam.     Fetal Exam Fetal Monitor Review: Variability: moderate (6-25 bpm).   Pattern: accelerations present and no decelerations.    Fetal State Assessment: Category I - tracings are normal.    Bedside U/S confirms vertex presentation Physical Exam  Constitutional: She is oriented to person, place, and time. She appears well-developed and well-nourished.  GI: Soft. There is no rebound and no guarding.  Genitourinary:    Vulva normal.   Neurological: She is alert and oriented to person, place, and time.  Skin: Skin is warm and dry.  Psychiatric: She has a normal mood and affect. Her behavior is normal.    Prenatal labs: ABO, Rh: O/Positive/-- (08/06 0000) Antibody: Negative (08/06 0000) Rubella: Immune (08/06 0000) RPR: Nonreactive (08/06 0000)  HBsAg: Negative (08/06 0000)  HIV: Non-reactive (08/06 0000)  GBS: Negative (03/09 0000)   Assessment/Plan: 27yo G2P1001 at 39 weeks for elective IOL  -Start pitocin -AROM when in good CTX  pattern -Epidural when desired -Anticipate NSVD  Mitchel Honour 07/21/2018, 8:39 AM

## 2018-07-21 NOTE — Progress Notes (Signed)
Dr Langston Masker at bedside for AROM attempt.  Pt declined.  Wants epidural placed prior to AROM.

## 2018-07-21 NOTE — Anesthesia Preprocedure Evaluation (Signed)
Anesthesia Evaluation  Patient identified by MRN, date of birth, ID band Patient awake    Reviewed: Allergy & Precautions, NPO status , Patient's Chart, lab work & pertinent test results  History of Anesthesia Complications (+) PONV and history of anesthetic complications  Airway Mallampati: II  TM Distance: >3 FB Neck ROM: Full    Dental no notable dental hx.    Pulmonary neg pulmonary ROS,    Pulmonary exam normal breath sounds clear to auscultation       Cardiovascular negative cardio ROS Normal cardiovascular exam Rhythm:Regular Rate:Normal     Neuro/Psych  Headaches, negative psych ROS   GI/Hepatic negative GI ROS, Neg liver ROS,   Endo/Other  obesity  Renal/GU   negative genitourinary   Musculoskeletal negative musculoskeletal ROS (+)   Abdominal (+) + obese,   Peds negative pediatric ROS (+)  Hematology negative hematology ROS (+)   Anesthesia Other Findings   Reproductive/Obstetrics (+) Pregnancy                             Anesthesia Physical  Anesthesia Plan  ASA: II  Anesthesia Plan: Epidural   Post-op Pain Management:    Induction:   PONV Risk Score and Plan:   Airway Management Planned:   Additional Equipment:   Intra-op Plan:   Post-operative Plan:   Informed Consent: I have reviewed the patients History and Physical, chart, labs and discussed the procedure including the risks, benefits and alternatives for the proposed anesthesia with the patient or authorized representative who has indicated his/her understanding and acceptance.       Plan Discussed with:   Anesthesia Plan Comments:         Anesthesia Quick Evaluation

## 2018-07-21 NOTE — Anesthesia Procedure Notes (Signed)
Epidural Patient location during procedure: OB Start time: 07/21/2018 1:38 PM End time: 07/21/2018 1:42 PM  Staffing Anesthesiologist: Leilani Able, MD Performed: anesthesiologist   Preanesthetic Checklist Completed: patient identified, site marked, surgical consent, pre-op evaluation, timeout performed, IV checked, risks and benefits discussed and monitors and equipment checked  Epidural Patient position: sitting Prep: site prepped and draped and DuraPrep Patient monitoring: continuous pulse ox and blood pressure Approach: midline Location: L3-L4 Injection technique: LOR air  Needle:  Needle type: Tuohy  Needle gauge: 17 G Needle length: 9 cm and 9 Needle insertion depth: 6 cm Catheter type: closed end flexible Catheter size: 19 Gauge Catheter at skin depth: 11 cm Test dose: negative and Other  Assessment Sensory level: T9 Events: blood not aspirated, injection not painful, no injection resistance, negative IV test and no paresthesia  Additional Notes Reason for block:procedure for pain

## 2018-07-22 LAB — CBC
HEMATOCRIT: 32.6 % — AB (ref 36.0–46.0)
Hemoglobin: 10.2 g/dL — ABNORMAL LOW (ref 12.0–15.0)
MCH: 25.8 pg — ABNORMAL LOW (ref 26.0–34.0)
MCHC: 31.3 g/dL (ref 30.0–36.0)
MCV: 82.3 fL (ref 80.0–100.0)
Platelets: 222 10*3/uL (ref 150–400)
RBC: 3.96 MIL/uL (ref 3.87–5.11)
RDW: 14.8 % (ref 11.5–15.5)
WBC: 11 10*3/uL — ABNORMAL HIGH (ref 4.0–10.5)
nRBC: 0 % (ref 0.0–0.2)

## 2018-07-22 NOTE — Discharge Summary (Signed)
Obstetric Discharge Summary Reason for Admission: induction of labor Prenatal Procedures: none Intrapartum Procedures: spontaneous vaginal delivery Postpartum Procedures: none Complications-Operative and Postpartum: 2nd degree perineal laceration Hemoglobin  Date Value Ref Range Status  07/22/2018 10.2 (L) 12.0 - 15.0 g/dL Final   HCT  Date Value Ref Range Status  07/22/2018 32.6 (L) 36.0 - 46.0 % Final    Physical Exam:  General: alert, cooperative and appears stated age 28: appropriate Uterine Fundus: firm Incision: healing well, no significant drainage, no dehiscence, no significant erythema DVT Evaluation: No evidence of DVT seen on physical exam. Negative Homan's sign. No cords or calf tenderness. No significant calf/ankle edema.  Discharge Diagnoses: Term Pregnancy-delivered  Discharge Information: Date: 07/22/2018 Activity: pelvic rest Diet: routine Medications: PNV Condition: stable Instructions: refer to practice specific booklet Discharge to: home   Newborn Data: Live born female  Birth Weight: 7 lb 14.5 oz (3586 g) APGAR: 9, 9  Newborn Delivery   Birth date/time:  07/21/2018 20:21:00 Delivery type:  Vaginal, Spontaneous    D/W patient female infant circumcision, risks/benefits reviewed. All questions answered. Home with mother.  Ranae Pila 07/22/2018, 9:56 AM

## 2018-07-22 NOTE — Lactation Note (Signed)
This note was copied from a baby's chart. Lactation Consultation Note  Patient Name: Boy Niamya Selimovic HUDJS'H Date: 07/22/2018 Reason for consult: Initial assessment;Term Baby is 15 hours old/2% weight loss. She breastfed her first baby for 5 months.  Mom reports newborn is latching well.  Baby is currently in the nursery for circumcision.  Discussed baby may be sleepy after circ for awhile and then begin to cluster feed on day 2-3.  Instructed to feed with feeding cues using good waking techniques and breast massage.  Encouraged to call with concerns/assist prn.  Breastfeeding consultation services and support information given and reviewed.  Maternal Data Does the patient have breastfeeding experience prior to this delivery?: Yes  Feeding    LATCH Score                   Interventions    Lactation Tools Discussed/Used     Consult Status Consult Status: Follow-up Date: 07/23/18 Follow-up type: In-patient    Huston Foley 07/22/2018, 11:23 AM

## 2018-07-22 NOTE — Anesthesia Postprocedure Evaluation (Signed)
Anesthesia Post Note  Patient: Lynn Mendez  Procedure(s) Performed: AN AD HOC LABOR EPIDURAL     Patient location during evaluation: Mother Baby Anesthesia Type: Epidural Level of consciousness: awake and alert Pain management: pain level controlled Vital Signs Assessment: post-procedure vital signs reviewed and stable Respiratory status: spontaneous breathing, nonlabored ventilation and respiratory function stable Cardiovascular status: stable Postop Assessment: no headache, no backache, epidural receding, no apparent nausea or vomiting, patient able to bend at knees, able to ambulate and adequate PO intake Anesthetic complications: no    Last Vitals:  Vitals:   07/21/18 2340 07/22/18 0330  BP: 127/83 117/77  Pulse: 77 66  Resp: 18 16  Temp: 36.8 C 36.8 C  SpO2: 100% 99%    Last Pain:  Vitals:   07/22/18 0616  TempSrc:   PainSc: 3    Pain Goal:                   Laban Emperor

## 2018-07-24 ENCOUNTER — Inpatient Hospital Stay (HOSPITAL_COMMUNITY)
Admission: AD | Admit: 2018-07-24 | Payer: BLUE CROSS/BLUE SHIELD | Source: Ambulatory Visit | Admitting: Obstetrics and Gynecology

## 2018-07-25 ENCOUNTER — Inpatient Hospital Stay (HOSPITAL_COMMUNITY): Payer: BLUE CROSS/BLUE SHIELD

## 2021-04-16 LAB — OB RESULTS CONSOLE ANTIBODY SCREEN: Antibody Screen: NEGATIVE

## 2021-04-16 LAB — OB RESULTS CONSOLE HEPATITIS B SURFACE ANTIGEN: Hepatitis B Surface Ag: NEGATIVE

## 2021-04-16 LAB — OB RESULTS CONSOLE ABO/RH: RH Type: POSITIVE

## 2021-04-16 LAB — OB RESULTS CONSOLE RUBELLA ANTIBODY, IGM: Rubella: IMMUNE

## 2021-04-16 LAB — OB RESULTS CONSOLE HIV ANTIBODY (ROUTINE TESTING): HIV: NONREACTIVE

## 2021-04-16 LAB — HEPATITIS C ANTIBODY: HCV Ab: NEGATIVE

## 2021-04-16 LAB — OB RESULTS CONSOLE RPR: RPR: NONREACTIVE

## 2021-05-06 LAB — OB RESULTS CONSOLE GC/CHLAMYDIA
Chlamydia: NEGATIVE
Neisseria Gonorrhea: NEGATIVE

## 2021-05-11 NOTE — L&D Delivery Note (Signed)
Delivery Note At 10:35 AM a viable female was delivered via Vaginal, Spontaneous (Presentation: Right Occiput Anterior).  APGAR: 8, 9; weight pending.   Placenta status: Spontaneous, Intact.  Cord: 3 vessels with the following complications: None.  Cord pH: n/a  Anesthesia: Epidural Episiotomy: 2nd degree  Lacerations:  n/a Suture Repair: 3.0 vicryl rapide Est. Blood Loss (mL):  200  Mom to postpartum.  Baby to Couplet care / Skin to Skin.  Mitchel Honour 11/19/2021, 10:58 AM

## 2021-10-29 LAB — OB RESULTS CONSOLE GBS: GBS: NEGATIVE

## 2021-11-06 ENCOUNTER — Encounter (HOSPITAL_COMMUNITY): Payer: Self-pay | Admitting: *Deleted

## 2021-11-06 ENCOUNTER — Telehealth (HOSPITAL_COMMUNITY): Payer: Self-pay | Admitting: *Deleted

## 2021-11-06 NOTE — Telephone Encounter (Signed)
Preadmission screen  

## 2021-11-18 ENCOUNTER — Other Ambulatory Visit: Payer: Self-pay

## 2021-11-19 ENCOUNTER — Encounter (HOSPITAL_COMMUNITY): Payer: Self-pay | Admitting: Obstetrics & Gynecology

## 2021-11-19 ENCOUNTER — Inpatient Hospital Stay (HOSPITAL_COMMUNITY): Payer: BC Managed Care – PPO | Admitting: Anesthesiology

## 2021-11-19 ENCOUNTER — Inpatient Hospital Stay (HOSPITAL_COMMUNITY)
Admission: AD | Admit: 2021-11-19 | Discharge: 2021-11-20 | DRG: 807 | Disposition: A | Discharge status: Home / Self Care | Payer: BC Managed Care – PPO | Attending: Obstetrics & Gynecology | Admitting: Obstetrics & Gynecology

## 2021-11-19 ENCOUNTER — Other Ambulatory Visit: Payer: Self-pay

## 2021-11-19 ENCOUNTER — Inpatient Hospital Stay (HOSPITAL_COMMUNITY): Payer: BC Managed Care – PPO

## 2021-11-19 DIAGNOSIS — O26893 Other specified pregnancy related conditions, third trimester: Principal | ICD-10-CM | POA: Diagnosis present

## 2021-11-19 DIAGNOSIS — Z3A39 39 weeks gestation of pregnancy: Secondary | ICD-10-CM

## 2021-11-19 DIAGNOSIS — Z3490 Encounter for supervision of normal pregnancy, unspecified, unspecified trimester: Principal | ICD-10-CM

## 2021-11-19 LAB — CBC
HCT: 34.3 % — ABNORMAL LOW (ref 36.0–46.0)
Hemoglobin: 11.3 g/dL — ABNORMAL LOW (ref 12.0–15.0)
MCH: 26.6 pg (ref 26.0–34.0)
MCHC: 32.9 g/dL (ref 30.0–36.0)
MCV: 80.7 fL (ref 80.0–100.0)
Platelets: 247 10*3/uL (ref 150–400)
RBC: 4.25 MIL/uL (ref 3.87–5.11)
RDW: 14.2 % (ref 11.5–15.5)
WBC: 9.1 10*3/uL (ref 4.0–10.5)
nRBC: 0 % (ref 0.0–0.2)

## 2021-11-19 LAB — TYPE AND SCREEN
ABO/RH(D): O POS
Antibody Screen: NEGATIVE

## 2021-11-19 LAB — RPR: RPR Ser Ql: NONREACTIVE

## 2021-11-19 MED ORDER — LACTATED RINGERS IV SOLN: 500.0000 mL | Freq: Once | INTRAVENOUS | Status: DC

## 2021-11-19 MED ORDER — PHENYLEPHRINE 80 MCG/ML (10ML) SYRINGE FOR IV PUSH (FOR BLOOD PRESSURE SUPPORT): 80.0000 ug | PREFILLED_SYRINGE | INTRAVENOUS | Status: DC | PRN

## 2021-11-19 MED ORDER — PRENATAL MULTIVITAMIN CH
1.0000 | ORAL_TABLET | Freq: Every day | ORAL | Status: DC
Start: 2021-11-19 — End: 2021-11-20

## 2021-11-19 MED ORDER — LIDOCAINE HCL (PF) 1 % IJ SOLN
INTRAMUSCULAR | Status: DC | PRN
  Administered 2021-11-19: 8 mL via EPIDURAL

## 2021-11-19 MED ORDER — ZOLPIDEM TARTRATE 5 MG PO TABS: 5.0000 mg | ORAL_TABLET | Freq: Every evening | ORAL | Status: DC | PRN

## 2021-11-19 MED ORDER — OXYCODONE-ACETAMINOPHEN 5-325 MG PO TABS: 2.0000 | ORAL_TABLET | ORAL | Status: DC | PRN

## 2021-11-19 MED ORDER — FENTANYL CITRATE (PF) 100 MCG/2ML IJ SOLN: 50.0000 ug | INTRAMUSCULAR | Status: DC | PRN

## 2021-11-19 MED ORDER — BENZOCAINE-MENTHOL 20-0.5 % EX AERO
1.0000 | INHALATION_SPRAY | CUTANEOUS | Status: DC | PRN
  Administered 2021-11-19: 1 via TOPICAL
  Filled 2021-11-19: qty 56

## 2021-11-19 MED ORDER — OXYTOCIN-SODIUM CHLORIDE 30-0.9 UT/500ML-% IV SOLN
1.0000 m[IU]/min | INTRAVENOUS | Status: DC
  Administered 2021-11-19 (×2): 2 m[IU]/min via INTRAVENOUS
  Filled 2021-11-19: qty 500

## 2021-11-19 MED ORDER — LACTATED RINGERS IV SOLN: 500.0000 mL | INTRAVENOUS | Status: DC | PRN

## 2021-11-19 MED ORDER — PHENYLEPHRINE 80 MCG/ML (10ML) SYRINGE FOR IV PUSH (FOR BLOOD PRESSURE SUPPORT)
80.0000 ug | PREFILLED_SYRINGE | INTRAVENOUS | Status: DC | PRN
  Administered 2021-11-19 (×2): 80 ug via INTRAVENOUS
  Filled 2021-11-19: qty 10

## 2021-11-19 MED ORDER — SOD CITRATE-CITRIC ACID 500-334 MG/5ML PO SOLN: 30.0000 mL | ORAL | Status: DC | PRN

## 2021-11-19 MED ORDER — ONDANSETRON HCL 4 MG PO TABS: 4.0000 mg | ORAL_TABLET | ORAL | Status: DC | PRN

## 2021-11-19 MED ORDER — DIPHENHYDRAMINE HCL 25 MG PO CAPS: 25.0000 mg | ORAL_CAPSULE | Freq: Four times a day (QID) | ORAL | Status: DC | PRN

## 2021-11-19 MED ORDER — IBUPROFEN 600 MG PO TABS
600.0000 mg | ORAL_TABLET | Freq: Four times a day (QID) | ORAL | Status: DC
  Administered 2021-11-19 – 2021-11-20 (×3): 600 mg via ORAL
  Filled 2021-11-19 (×3): qty 1

## 2021-11-19 MED ORDER — OXYTOCIN-SODIUM CHLORIDE 30-0.9 UT/500ML-% IV SOLN: 2.5000 [IU]/h | INTRAVENOUS | Status: DC

## 2021-11-19 MED ORDER — LACTATED RINGERS IV SOLN: INTRAVENOUS | Status: DC

## 2021-11-19 MED ORDER — DIBUCAINE (PERIANAL) 1 % EX OINT: 1.0000 | TOPICAL_OINTMENT | CUTANEOUS | Status: DC | PRN

## 2021-11-19 MED ORDER — COCONUT OIL OIL: 1.0000 | TOPICAL_OIL | Status: DC | PRN

## 2021-11-19 MED ORDER — ONDANSETRON HCL 4 MG/2ML IJ SOLN: 4.0000 mg | INTRAMUSCULAR | Status: DC | PRN

## 2021-11-19 MED ORDER — ONDANSETRON HCL 4 MG/2ML IJ SOLN
4.0000 mg | Freq: Four times a day (QID) | INTRAMUSCULAR | Status: DC | PRN
  Administered 2021-11-19: 4 mg via INTRAVENOUS
  Filled 2021-11-19: qty 2

## 2021-11-19 MED ORDER — OXYCODONE-ACETAMINOPHEN 5-325 MG PO TABS: 1.0000 | ORAL_TABLET | ORAL | Status: DC | PRN

## 2021-11-19 MED ORDER — ACETAMINOPHEN 325 MG PO TABS: 650.0000 mg | ORAL_TABLET | ORAL | Status: DC | PRN

## 2021-11-19 MED ORDER — TERBUTALINE SULFATE 1 MG/ML IJ SOLN
0.2500 mg | Freq: Once | INTRAMUSCULAR | Status: DC | PRN
  Filled 2021-11-19: qty 1

## 2021-11-19 MED ORDER — FENTANYL-BUPIVACAINE-NACL 0.5-0.125-0.9 MG/250ML-% EP SOLN
12.0000 mL/h | EPIDURAL | Status: DC | PRN
  Administered 2021-11-19: 12 mL/h via EPIDURAL
  Filled 2021-11-19: qty 250

## 2021-11-19 MED ORDER — SIMETHICONE 80 MG PO CHEW: 80.0000 mg | CHEWABLE_TABLET | ORAL | Status: DC | PRN

## 2021-11-19 MED ORDER — EPHEDRINE 5 MG/ML INJ: 10.0000 mg | INTRAVENOUS | Status: DC | PRN

## 2021-11-19 MED ORDER — OXYTOCIN BOLUS FROM INFUSION
333.0000 mL | Freq: Once | INTRAVENOUS | Status: AC
  Administered 2021-11-19: 333 mL via INTRAVENOUS

## 2021-11-19 MED ORDER — WITCH HAZEL-GLYCERIN EX PADS: 1.0000 | MEDICATED_PAD | CUTANEOUS | Status: DC | PRN

## 2021-11-19 MED ORDER — DIPHENHYDRAMINE HCL 50 MG/ML IJ SOLN: 12.5000 mg | INTRAMUSCULAR | Status: DC | PRN

## 2021-11-19 MED ORDER — LIDOCAINE HCL (PF) 1 % IJ SOLN: 30.0000 mL | INTRAMUSCULAR | Status: DC | PRN

## 2021-11-19 MED ORDER — SENNOSIDES-DOCUSATE SODIUM 8.6-50 MG PO TABS
2.0000 | ORAL_TABLET | Freq: Every day | ORAL | Status: DC
  Administered 2021-11-20: 2 via ORAL
  Filled 2021-11-19: qty 2

## 2021-11-19 MED ORDER — TETANUS-DIPHTH-ACELL PERTUSSIS 5-2.5-18.5 LF-MCG/0.5 IM SUSY: 0.5000 mL | PREFILLED_SYRINGE | Freq: Once | INTRAMUSCULAR | Status: DC

## 2021-11-19 NOTE — Anesthesia Preprocedure Evaluation (Signed)
Anesthesia Evaluation  Patient identified by MRN, date of birth, ID band Patient awake    Reviewed: Allergy & Precautions, NPO status , Patient's Chart, lab work & pertinent test results  History of Anesthesia Complications (+) PONV and history of anesthetic complications  Airway Mallampati: II  TM Distance: >3 FB Neck ROM: Full    Dental no notable dental hx.    Pulmonary neg pulmonary ROS,    Pulmonary exam normal breath sounds clear to auscultation       Cardiovascular negative cardio ROS Normal cardiovascular exam Rhythm:Regular Rate:Normal     Neuro/Psych  Headaches, negative psych ROS   GI/Hepatic negative GI ROS, Neg liver ROS,   Endo/Other  negative endocrine ROS  Renal/GU Renal disease  negative genitourinary   Musculoskeletal negative musculoskeletal ROS (+)   Abdominal   Peds negative pediatric ROS (+)  Hematology  (+) Blood dyscrasia, anemia ,   Anesthesia Other Findings   Reproductive/Obstetrics (+) Pregnancy                             Anesthesia Physical Anesthesia Plan  ASA: 2  Anesthesia Plan: Epidural   Post-op Pain Management:    Induction:   PONV Risk Score and Plan: 3 and Treatment may vary due to age or medical condition  Airway Management Planned: Natural Airway  Additional Equipment: None  Intra-op Plan:   Post-operative Plan:   Informed Consent: I have reviewed the patients History and Physical, chart, labs and discussed the procedure including the risks, benefits and alternatives for the proposed anesthesia with the patient or authorized representative who has indicated his/her understanding and acceptance.       Plan Discussed with: Anesthesiologist  Anesthesia Plan Comments:         Anesthesia Quick Evaluation

## 2021-11-19 NOTE — Lactation Note (Signed)
This note was copied from a baby's chart. Lactation Consultation Note  Patient Name: Girl Elsia Lasota [id number] Date: 11/19/2021   Age:33 hours  LC attempted to visit with mom, but RN was in the room. Will follow up later.  Maternal Data    Feeding    LATCH Score Latch: Grasps breast easily, tongue down, lips flanged, rhythmical sucking.  Audible Swallowing: A few with stimulation  Type of Nipple: Everted at rest and after stimulation  Comfort (Breast/Nipple): Soft / non-tender  Hold (Positioning): No assistance needed to correctly position infant at breast.  LATCH Score: 9   Lactation Tools Discussed/Used    Interventions    Discharge    Consult Status      Nirvaan Frett P Alveria Mcglaughlin 11/19/2021, 1:50 PM

## 2021-11-19 NOTE — Lactation Note (Signed)
This note was copied from a baby's chart. Lactation Consultation Note  Patient Name: Lynn Mendez Date: 11/19/2021 Reason for consult: Follow-up assessment;Mother's request;Term;Breastfeeding assistance Age:33 hours  P3, Term, Infant Female  Baby at 6 hours. LC to patient room for latch check. LC entered the room and mom was breastfeeding baby on the left breast in the cross cradle hold. Per mom, there is some soreness with the initial latch, but it gets better as the feeding continues.   Baby latched deeply with flanged lips and audible swallows were heard.   Baby fed for 10 min prior to [facility] entering the room and fed for 5 min while LC was in the room.   Mom will call RN/LC for latch assistance if needed.  Maternal Data Does the patient have breastfeeding experience prior to this delivery?: Yes How long did the patient breastfeed?: 4 months and 6 months  Feeding Mother's Current Feeding Choice: Breast Milk  LATCH Score Latch: Grasps breast easily, tongue down, lips flanged, rhythmical sucking.  Audible Swallowing: Spontaneous and intermittent  Type of Nipple: Everted at rest and after stimulation  Comfort (Breast/Nipple): Filling, red/small blisters or bruises, mild/mod discomfort  Hold (Positioning): No assistance needed to correctly position infant at breast.  LATCH Score: 9   Lactation Tools Discussed/Used    Interventions Interventions: Breast feeding basics reviewed;Education  Discharge Pump: DEBP;Hands Free;Personal ([employer] and [employer] Go)  Consult Status Consult Status: Follow-up Date: 11/20/21 Follow-up type: In-patient    Lynn Mendez 11/19/2021, 5:34 PM

## 2021-11-19 NOTE — Anesthesia Postprocedure Evaluation (Signed)
Anesthesia Post Note  Patient: Nikea Settle  Procedure(s) Performed: AN AD HOC LABOR EPIDURAL     Patient location during evaluation: Mother Baby Anesthesia Type: Epidural Level of consciousness: awake and alert Pain management: pain level controlled Vital Signs Assessment: post-procedure vital signs reviewed and stable Respiratory status: spontaneous breathing, nonlabored ventilation and respiratory function stable Cardiovascular status: stable Postop Assessment: no headache, no backache and epidural receding Anesthetic complications: no   No notable events documented.  Last Vitals:  Vitals:   11/19/21 1220 11/19/21 1335  BP: 117/81 112/64  Pulse: (!) 55 (!) 55  Resp: 18 18  Temp: 36.8 C 36.8 C  SpO2:      Last Pain:  Vitals:   11/19/21 1335  TempSrc: Oral  PainSc: 0-No pain   Pain Goal:                   Desarie Feild

## 2021-11-19 NOTE — Anesthesia Procedure Notes (Signed)
Epidural Patient location during procedure: OB Start time: 11/19/2021 6:15 AM End time: 11/19/2021 6:25 AM  Staffing Anesthesiologist: Mellody Dance, MD Performed: anesthesiologist   Preanesthetic Checklist Completed: patient identified, IV checked, site marked, risks and benefits discussed, monitors and equipment checked, pre-op evaluation and timeout performed  Epidural Patient position: sitting Prep: DuraPrep Patient monitoring: heart rate, cardiac monitor, continuous pulse ox and blood pressure Approach: midline Location: L2-L3 Injection technique: LOR saline  Needle:  Needle type: Tuohy  Needle gauge: 17 G Needle length: 9 cm Needle insertion depth: 5 cm Catheter type: closed end flexible Catheter size: 20 Guage Catheter at skin depth: 10 cm Test dose: negative and Other  Assessment Events: blood not aspirated, injection not painful, no injection resistance and negative IV test  Additional Notes Informed consent obtained prior to proceeding including risk of failure, 1% risk of PDPH, risk of minor discomfort and bruising.  Discussed rare but serious complications including epidural abscess, permanent nerve injury, epidural hematoma.  Discussed alternatives to epidural analgesia and patient desires to proceed.  Timeout performed pre-procedure verifying patient name, procedure, and platelet count.  Patient tolerated procedure well.

## 2021-11-19 NOTE — H&P (Signed)
Lynn Mendez is a 31 y.o. female G3P2002 at [redacted]w[redacted]d presenting for elective IOL.  Patient is comfortable with CLEA.  Prolonged deceleration after CLEA placement which resolved with phenylephrine x 2.  Hx of migraines.  GBS negative.  OB History     Gravida  3   Para  2   Term  2   Preterm      AB      Living  2      SAB      IAB      Ectopic      Multiple  0   Live Births  2          Past Medical History:  Diagnosis Date   Allergy    Chicken pox    Kidney stones    Migraines    Pancreatitis    PONV (postoperative nausea and vomiting)    Past Surgical History:  Procedure Laterality Date   CHOLECYSTECTOMY     ERCP     Family History: family history includes Breast cancer in her paternal uncle; Cancer in her paternal aunt and paternal grandmother; Diabetes in her mother; Heart attack in her paternal grandfather; Hypertension in her mother; Lung cancer in her paternal grandmother; Other in her sister; Skin cancer in her paternal grandmother; Spina bifida in her brother; Uterine cancer in her paternal aunt. Social History:  reports that she has never smoked. She has never used smokeless tobacco. She reports that she does not drink alcohol and does not use drugs.     Maternal Diabetes: No Genetic Screening: Normal Maternal Ultrasounds/Referrals: Normal Fetal Ultrasounds or other Referrals:  None Maternal Substance Abuse:  No Significant Maternal Medications:  None Significant Maternal Lab Results:  Group B Strep negative Other Comments:  None  Review of Systems Maternal Medical History:  Fetal activity: Perceived fetal activity is normal.   Last perceived fetal movement was within the past hour.   Prenatal complications: no prenatal complications Prenatal Complications - Diabetes: none.   Dilation: 5 Effacement (%): 80 Station: -1 Exam by:: Dr. Langston Masker Blood pressure 111/80, pulse 71, temperature 98.6 F (37 C), temperature source Oral, resp. rate  15, height 5' 7.5" (1.715 m), weight 110.8 kg, SpO2 100 %, unknown if currently breastfeeding. Maternal Exam:  Uterine Assessment: Contraction strength is mild.  Abdomen: Patient reports no abdominal tenderness. Fundal height is c/w dates.   Estimated fetal weight is 8#.   Fetal presentation: vertex Introitus: Normal vulva. Amniotic fluid character: meconium stained. Pelvis: adequate for delivery.   Cervix: Cervix evaluated by digital exam.     Fetal Exam Fetal Monitor Review: Baseline rate: 130.  Variability: moderate (6-25 bpm).   Pattern: accelerations present and no decelerations.   Fetal State Assessment: Category I - tracings are normal.   Physical Exam Constitutional:      Appearance: Normal appearance.  HENT:     Head: Normocephalic and atraumatic.  Pulmonary:     Effort: Pulmonary effort is normal.  Abdominal:     Palpations: Abdomen is soft.  Genitourinary:    General: Normal vulva.  Musculoskeletal:        General: Normal range of motion.     Cervical back: Normal range of motion.  Skin:    General: Skin is warm and dry.  Neurological:     Mental Status: She is alert and oriented to person, place, and time.  Psychiatric:        Mood and Affect: Mood normal.  Behavior: Behavior normal.     Prenatal labs: ABO, Rh: --/--/O POS (07/12 0022) Antibody: NEG (07/12 0022) Rubella: Immune (12/07 0000) RPR: NON REACTIVE (07/12 0029)  HBsAg: Negative (12/07 0000)  HIV: Non-reactive (12/07 0000)  GBS: Negative/-- (06/21 0000)   Assessment/Plan: [id number] at [redacted]w[redacted]d for eIOL -S/P AROM -Restart pitocin -Anticipate NSVD   Mitchel Honour 11/19/2021, 8:00 AM

## 2021-11-19 NOTE — Lactation Note (Signed)
This note was copied from a baby's chart. Lactation Consultation Note  Patient Name: Lynn Mendez YIFOY'D Date: 11/19/2021 Reason for consult: Initial assessment;Term;Breastfeeding assistance Age:33 hours  P3, Term, Infant Female  Infant at 74 hours old. LC entered the room and baby was asleep STS with mom. Mom states that feedings have been going well. Mom says that she has breast fed her older two children.   Per mom, baby has fed 3 times. Mom denies any pain. Mom states that she has been taught how to hand express.   LC spoke with mom about infant behavior on day 1 of life and encouraged her to feed baby according to feeding cues.   Mom will call RN when baby is ready to feed again for latch assessment.   Current Feeding Plan:  Breastfeed baby 8+ times in 24 hours according to feeding cues.  Hand express and feed baby expressed milk via a spoon.  Call LC/RN for latch assistance.   Maternal Data    Feeding Mother's Current Feeding Choice: Breast Milk  LATCH Score                    Lactation Tools Discussed/Used    Interventions Interventions: Breast feeding basics reviewed;Education  Discharge    Consult Status Consult Status: Follow-up Date: 11/20/21 Follow-up type: In-patient    Lynn Mendez 11/19/2021, 4:35 PM

## 2021-11-20 LAB — CBC
HCT: 31.2 % — ABNORMAL LOW (ref 36.0–46.0)
Hemoglobin: 10 g/dL — ABNORMAL LOW (ref 12.0–15.0)
MCH: 26.7 pg (ref 26.0–34.0)
MCHC: 32.1 g/dL (ref 30.0–36.0)
MCV: 83.2 fL (ref 80.0–100.0)
Platelets: 188 10*3/uL (ref 150–400)
RBC: 3.75 MIL/uL — ABNORMAL LOW (ref 3.87–5.11)
RDW: 14.3 % (ref 11.5–15.5)
WBC: 8.5 10*3/uL (ref 4.0–10.5)
nRBC: 0 % (ref 0.0–0.2)

## 2021-11-20 NOTE — Social Work (Signed)
CSW received consult for hx of mild Anxiety.  CSW met with MOB to offer support and complete assessment.    CSW introduced role and offered privacy, MOB stated she was okay with FOB being in the room. CSW observed MOB holding the infant and FOB sitting next to her. CSW Inquired about how MOB has been feeling since giving birth. MOB stated she was feeling pretty good. CSW inquired about MOB hx of anxiety. MOB reported that since having children she has experienced some mild anxiety.  CSW inquired about medication or treatments for her Anxiety. MOB reported she has never taken medication in regards to her mental health  or received therapy. CSW inquired about how MOB copes with anxiety. MOB stated "I just talk myself down or have some retail therapy" . CSW inquired about other MH diagnosis MOB declined having any other diagnosis. MOB denied having any SI or HI. CSW inquired about supports. MOB identified FOB, her parents and FOB's family as her supports.   CSW provided education regarding the baby blues period vs. perinatal mood disorders, discussed treatment and gave resources for mental health follow up if concerns arise.  MOB reported she did not experience PPD with her other children. CSW recommends self-evaluation during the postpartum time period using the New Mom Checklist from Postpartum Progress and encouraged MOB to contact a medical professional if symptoms are noted at any time.    MOB reported they will be using [facility]- Dr. Sharlene Motts for infants follow up care. CSW provided review of Sudden Infant Death Syndrome (SIDS) precautions. MOB reported the have all necessary items for the infant including a bassinet where the baby will sleep.   CSW identifies no further need for intervention and no barriers to discharge at this time.  [place] Social Worker [phone]

## 2021-11-20 NOTE — Discharge Summary (Signed)
Postpartum Discharge Summary  Date of Service November 20, 2021     Patient Name: Lynn Mendez DOB: 1991/04/28 MRN: [id number]  Date of admission: 11/19/2021 Delivery date:11/19/2021  Delivering provider: Linda Hedges  Date of discharge: 11/20/2021  Admitting diagnosis: Pregnancy [Z34.90] Intrauterine pregnancy: [redacted]w[redacted]d    Secondary diagnosis:  Principal Problem:   Pregnancy  Additional problems: none    Discharge diagnosis: Term Pregnancy Delivered                                              Post partum procedures: none Augmentation: AROM, Pitocin, and Cytotec Complications: None  Hospital course: Induction of Labor With Vaginal Delivery   31y.o. yo G3P3003 at 388w2das admitted to the hospital 11/19/2021 for induction of labor.  Indication for induction: Elective.  Patient had an uncomplicated labor course as follows: Membrane Rupture Time/Date: 7:53 AM ,11/19/2021   Delivery Method:Vaginal, Spontaneous  Episiotomy: Left Mediolateral  Lacerations:  2nd degree  Details of delivery can be found in separate delivery note.  Patient had a routine postpartum course. Patient is discharged home 11/20/21.  Newborn Data: Birth date:11/19/2021  Birth time:10:35 AM  Gender:Female  Living status:Living  Apgars:8 ,9  Weight:4200 g   Magnesium Sulfate received: No BMZ received: No Rhophylac:N/A MMR:N/A T-DaP:Given prenatally Flu: N/A Transfusion:No  Physical exam  Vitals:   11/19/21 1747 11/19/21 2047 11/20/21 0000 11/20/21 0559  BP: 123/72 118/75 116/71 105/77  Pulse: (!) 58 (!) 56 (!) 59 (!) 54  Resp: '17 18 18 18  ' Temp: 98 F (36.7 C) 98.2 F (36.8 C) 98.3 F (36.8 C) 98.2 F (36.8 C)  TempSrc: Oral Oral Oral Oral  SpO2: 98%  99%   Weight:      Height:       General: alert, cooperative, and no distress Lochia: appropriate Uterine Fundus: firm Incision: Healing well with no significant drainage DVT Evaluation: No evidence of DVT seen on physical  exam. Labs: Lab Results  Component Value Date   WBC 8.5 11/20/2021   HGB 10.0 (L) 11/20/2021   HCT 31.2 (L) 11/20/2021   MCV 83.2 11/20/2021   PLT 188 11/20/2021      Latest Ref Rng & Units 04/06/2014    7:30 AM  CMP  Glucose 70 - 99 mg/dL 110   BUN 6 - 23 mg/dL 12   Creatinine 0.50 - 1.10 mg/dL 0.90   Sodium 137 - 147 mEq/L 142   Potassium 3.7 - 5.3 mEq/L 3.9   Chloride 96 - 112 mEq/L 105   CO2 19 - 32 mEq/L 24   Calcium 8.4 - 10.5 mg/dL 9.8   Total Protein 6.0 - 8.3 g/dL 7.4   Total Bilirubin 0.3 - 1.2 mg/dL 0.6   Alkaline Phos 39 - 117 U/L 105   AST 0 - 37 U/L 17   ALT 0 - 35 U/L 15    Edinburgh Score:    07/22/2018    1:20 PM  Edinburgh Postnatal Depression Scale Screening Tool  I have been able to laugh and see the funny side of things. 0  I have looked forward with enjoyment to things. 0  I have blamed myself unnecessarily when things went wrong. 1  I have been anxious or worried for no good reason. 2  I have felt scared or panicky for no good reason. 0  Things have been getting on top of me. 1  I have been so unhappy that I have had difficulty sleeping. 0  I have felt sad or miserable. 0  I have been so unhappy that I have been crying. 0  The thought of harming myself has occurred to me. 0  Edinburgh Postnatal Depression Scale Total 4      After visit meds:  Allergies as of 11/20/2021       Reactions   Maxalt [rizatriptan Benzoate] Other (See Comments)   It made her migraine worse and caused N/V.        Medication List     TAKE these medications    acetaminophen 500 MG tablet Commonly known as: TYLENOL Take 1,000 mg by mouth every 6 (six) hours as needed for mild pain or headache.   prenatal multivitamin Tabs tablet Take 1 tablet by mouth at bedtime.         Discharge home in stable condition Infant Feeding: Breast Infant Disposition:home with mother Discharge instruction: per After Visit Summary and Postpartum booklet. Activity:  Advance as tolerated. Pelvic rest for 6 weeks.  Diet: routine diet Anticipated Birth Control: Unsure Postpartum Appointment:6 weeks Additional Postpartum F/U:  not applicable Future Appointments:No future appointments. Follow up Visit:      11/20/2021 Cyril Mourning, MD

## 2021-11-25 ENCOUNTER — Inpatient Hospital Stay (HOSPITAL_COMMUNITY): Payer: BC Managed Care – PPO

## 2021-11-27 ENCOUNTER — Telehealth (HOSPITAL_COMMUNITY): Payer: Self-pay | Admitting: *Deleted

## 2021-11-27 NOTE — Telephone Encounter (Signed)
Mom reports feeling good. No concerns about herself at this time. EPDS=0 [facility] score=1) Mom reports baby is doing well. Feeding, peeing, and pooping without difficulty. Safe sleep reviewed. Mom reports no concerns about baby at present.  Duffy Rhody, RN 11-27-2021 at 1;17pm
